# Patient Record
Sex: Female | Born: 1935 | Race: White | Hispanic: No | Smoking: Never smoker
Health system: Southern US, Community
[De-identification: ages and names within clinical notes are randomized; demographics above are authoritative.]

## PROBLEM LIST (undated history)

## (undated) DIAGNOSIS — S30814A Abrasion of vagina and vulva, initial encounter: Secondary | ICD-10-CM

## (undated) DIAGNOSIS — E119 Type 2 diabetes mellitus without complications: Secondary | ICD-10-CM

## (undated) DIAGNOSIS — N813 Complete uterovaginal prolapse: Secondary | ICD-10-CM

## (undated) DIAGNOSIS — IMO0002 Reserved for concepts with insufficient information to code with codable children: Secondary | ICD-10-CM

## (undated) DIAGNOSIS — N952 Postmenopausal atrophic vaginitis: Secondary | ICD-10-CM

## (undated) DIAGNOSIS — G4700 Insomnia, unspecified: Secondary | ICD-10-CM

## (undated) DIAGNOSIS — N814 Uterovaginal prolapse, unspecified: Secondary | ICD-10-CM

## (undated) DIAGNOSIS — N816 Rectocele: Secondary | ICD-10-CM

## (undated) DIAGNOSIS — I10 Essential (primary) hypertension: Secondary | ICD-10-CM

## (undated) DIAGNOSIS — N898 Other specified noninflammatory disorders of vagina: Secondary | ICD-10-CM

## (undated) DIAGNOSIS — N841 Polyp of cervix uteri: Secondary | ICD-10-CM

## (undated) HISTORY — DX: Type 2 diabetes mellitus without complications: E11.9

## (undated) HISTORY — DX: Uterovaginal prolapse, unspecified: N81.4

## (undated) HISTORY — PX: TUBAL LIGATION: SHX77

## (undated) HISTORY — DX: Postmenopausal atrophic vaginitis: N95.2

## (undated) HISTORY — DX: Rectocele: N81.6

## (undated) HISTORY — DX: Essential (primary) hypertension: I10

## (undated) HISTORY — DX: Complete uterovaginal prolapse: N81.3

## (undated) HISTORY — DX: Abrasion of vagina and vulva, initial encounter: S30.814A

## (undated) HISTORY — DX: Reserved for concepts with insufficient information to code with codable children: IMO0002

## (undated) HISTORY — DX: Other specified noninflammatory disorders of vagina: N89.8

## (undated) HISTORY — DX: Polyp of cervix uteri: N84.1

## (undated) HISTORY — DX: Insomnia, unspecified: G47.00

---

## 2010-11-29 ENCOUNTER — Emergency Department: Payer: Self-pay | Admitting: Emergency Medicine

## 2010-12-10 ENCOUNTER — Emergency Department: Payer: Self-pay | Admitting: Unknown Physician Specialty

## 2010-12-19 ENCOUNTER — Emergency Department: Payer: Self-pay | Admitting: Internal Medicine

## 2011-09-04 DIAGNOSIS — L821 Other seborrheic keratosis: Secondary | ICD-10-CM | POA: Diagnosis not present

## 2011-09-04 DIAGNOSIS — E119 Type 2 diabetes mellitus without complications: Secondary | ICD-10-CM | POA: Diagnosis not present

## 2011-09-04 DIAGNOSIS — I10 Essential (primary) hypertension: Secondary | ICD-10-CM | POA: Diagnosis not present

## 2011-12-01 DIAGNOSIS — L821 Other seborrheic keratosis: Secondary | ICD-10-CM | POA: Diagnosis not present

## 2011-12-01 DIAGNOSIS — I10 Essential (primary) hypertension: Secondary | ICD-10-CM | POA: Diagnosis not present

## 2011-12-01 DIAGNOSIS — E119 Type 2 diabetes mellitus without complications: Secondary | ICD-10-CM | POA: Diagnosis not present

## 2011-12-29 DIAGNOSIS — I10 Essential (primary) hypertension: Secondary | ICD-10-CM | POA: Diagnosis not present

## 2011-12-29 DIAGNOSIS — L821 Other seborrheic keratosis: Secondary | ICD-10-CM | POA: Diagnosis not present

## 2011-12-29 DIAGNOSIS — E119 Type 2 diabetes mellitus without complications: Secondary | ICD-10-CM | POA: Diagnosis not present

## 2012-01-25 DIAGNOSIS — E119 Type 2 diabetes mellitus without complications: Secondary | ICD-10-CM | POA: Diagnosis not present

## 2012-01-25 DIAGNOSIS — N814 Uterovaginal prolapse, unspecified: Secondary | ICD-10-CM | POA: Diagnosis not present

## 2012-01-25 DIAGNOSIS — L821 Other seborrheic keratosis: Secondary | ICD-10-CM | POA: Diagnosis not present

## 2012-01-25 DIAGNOSIS — I10 Essential (primary) hypertension: Secondary | ICD-10-CM | POA: Diagnosis not present

## 2012-02-23 DIAGNOSIS — N8111 Cystocele, midline: Secondary | ICD-10-CM | POA: Diagnosis not present

## 2012-02-23 DIAGNOSIS — N814 Uterovaginal prolapse, unspecified: Secondary | ICD-10-CM | POA: Diagnosis not present

## 2012-02-23 DIAGNOSIS — N816 Rectocele: Secondary | ICD-10-CM | POA: Diagnosis not present

## 2012-02-24 DIAGNOSIS — N814 Uterovaginal prolapse, unspecified: Secondary | ICD-10-CM | POA: Diagnosis not present

## 2012-02-24 DIAGNOSIS — N8111 Cystocele, midline: Secondary | ICD-10-CM | POA: Diagnosis not present

## 2012-02-24 DIAGNOSIS — N952 Postmenopausal atrophic vaginitis: Secondary | ICD-10-CM | POA: Diagnosis not present

## 2012-02-24 DIAGNOSIS — N813 Complete uterovaginal prolapse: Secondary | ICD-10-CM | POA: Diagnosis not present

## 2012-02-24 DIAGNOSIS — N816 Rectocele: Secondary | ICD-10-CM | POA: Diagnosis not present

## 2012-03-14 DIAGNOSIS — N8111 Cystocele, midline: Secondary | ICD-10-CM | POA: Diagnosis not present

## 2012-03-14 DIAGNOSIS — N816 Rectocele: Secondary | ICD-10-CM | POA: Diagnosis not present

## 2012-03-14 DIAGNOSIS — N952 Postmenopausal atrophic vaginitis: Secondary | ICD-10-CM | POA: Diagnosis not present

## 2012-03-14 DIAGNOSIS — N813 Complete uterovaginal prolapse: Secondary | ICD-10-CM | POA: Diagnosis not present

## 2012-03-22 DIAGNOSIS — N8111 Cystocele, midline: Secondary | ICD-10-CM | POA: Diagnosis not present

## 2012-03-22 DIAGNOSIS — N813 Complete uterovaginal prolapse: Secondary | ICD-10-CM | POA: Diagnosis not present

## 2012-03-22 DIAGNOSIS — N816 Rectocele: Secondary | ICD-10-CM | POA: Diagnosis not present

## 2012-04-19 DIAGNOSIS — N814 Uterovaginal prolapse, unspecified: Secondary | ICD-10-CM | POA: Diagnosis not present

## 2012-04-19 DIAGNOSIS — N952 Postmenopausal atrophic vaginitis: Secondary | ICD-10-CM | POA: Diagnosis not present

## 2012-04-19 DIAGNOSIS — N8111 Cystocele, midline: Secondary | ICD-10-CM | POA: Diagnosis not present

## 2012-04-19 DIAGNOSIS — N816 Rectocele: Secondary | ICD-10-CM | POA: Diagnosis not present

## 2012-04-27 DIAGNOSIS — N814 Uterovaginal prolapse, unspecified: Secondary | ICD-10-CM | POA: Diagnosis not present

## 2012-04-27 DIAGNOSIS — I10 Essential (primary) hypertension: Secondary | ICD-10-CM | POA: Diagnosis not present

## 2012-04-27 DIAGNOSIS — E119 Type 2 diabetes mellitus without complications: Secondary | ICD-10-CM | POA: Diagnosis not present

## 2012-05-31 DIAGNOSIS — N816 Rectocele: Secondary | ICD-10-CM | POA: Diagnosis not present

## 2012-05-31 DIAGNOSIS — N813 Complete uterovaginal prolapse: Secondary | ICD-10-CM | POA: Diagnosis not present

## 2012-05-31 DIAGNOSIS — N8111 Cystocele, midline: Secondary | ICD-10-CM | POA: Diagnosis not present

## 2012-05-31 DIAGNOSIS — IMO0002 Reserved for concepts with insufficient information to code with codable children: Secondary | ICD-10-CM | POA: Diagnosis not present

## 2012-05-31 IMAGING — CT CT HEAD WITHOUT CONTRAST
2 series · 16 of 30 positions shown, 20 images · non-contrast
Comparison: none

REASON FOR EXAM: fall, hit head on concrete
COMMENTS:

PROCEDURE:     CT  - CT HEAD WITHOUT CONTRAST  - November 29, 2010  [DATE]
RESULT:     History: Fall.
Comparison Study: No prior.

[Series 2: without · axial · non-contrast · 0.41mm/px · z∈[-212,-92]mm · 13 of 29 slices shown, 17 images]
[im 3/29  brain]
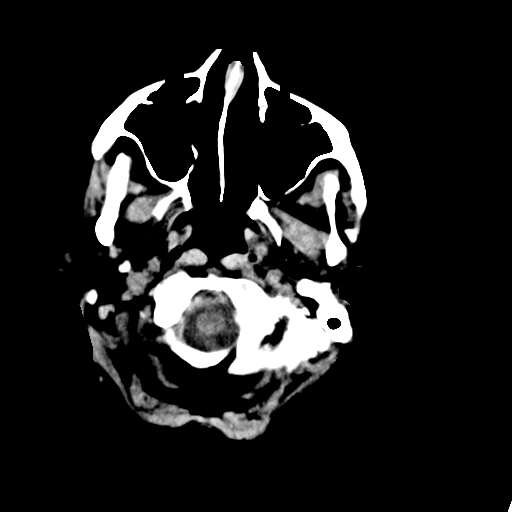
[im 3/29  bone]
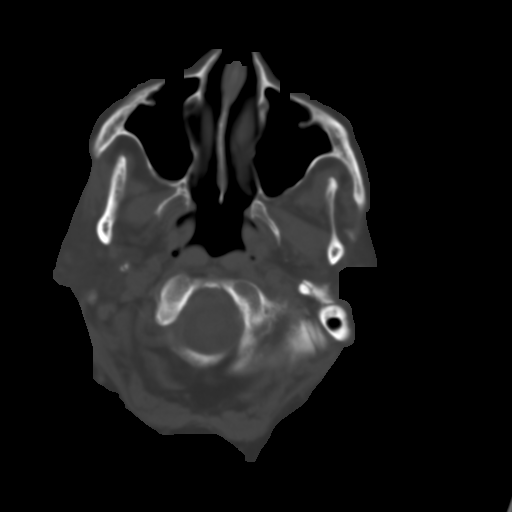
[im 5/29  brain]
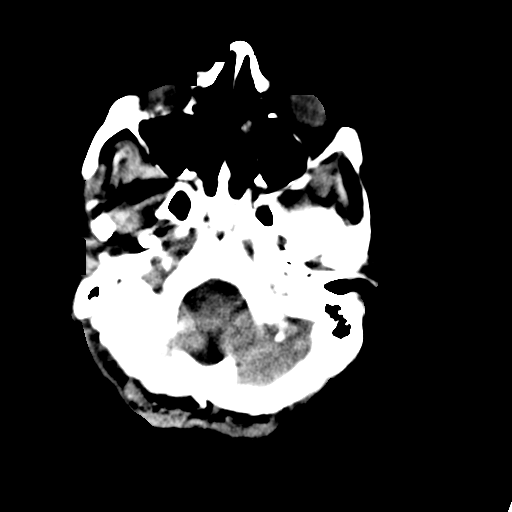
[im 7/29  brain]
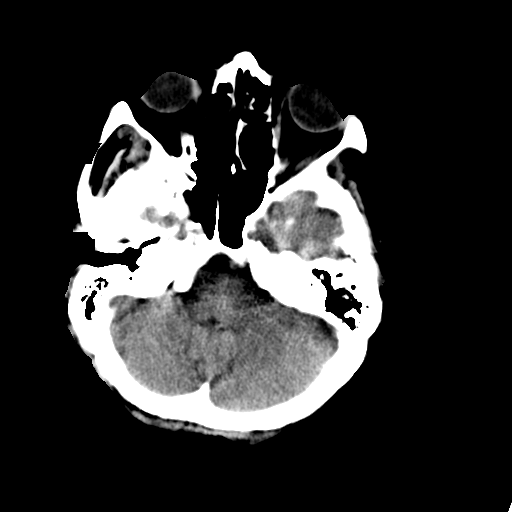
[im 9/29  brain]
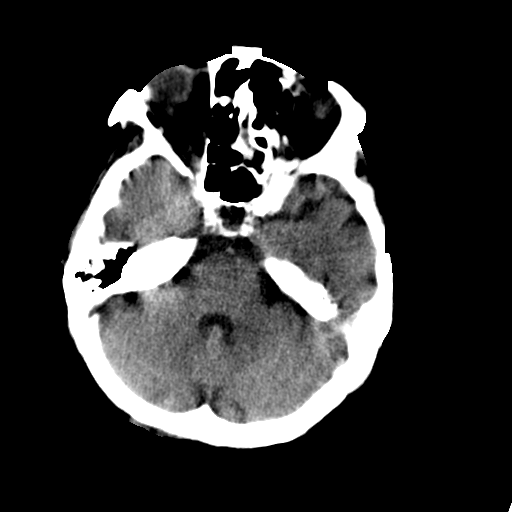
[im 11/29  brain]
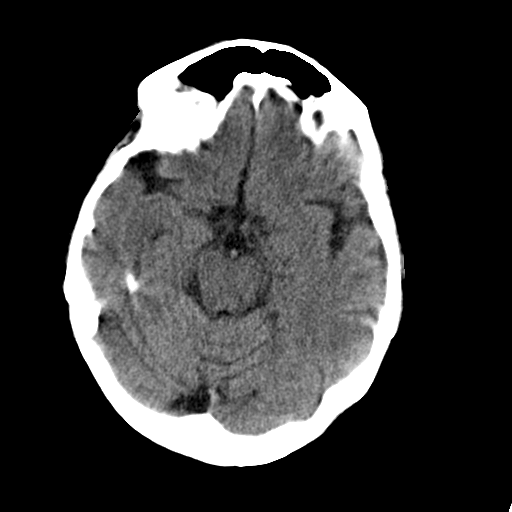
[im 11/29  bone]
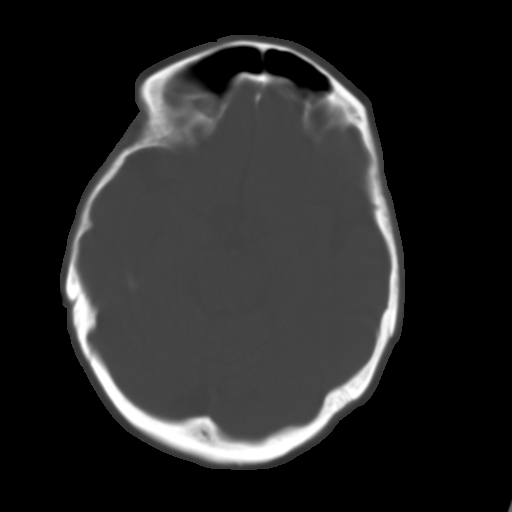
[im 13/29  brain]
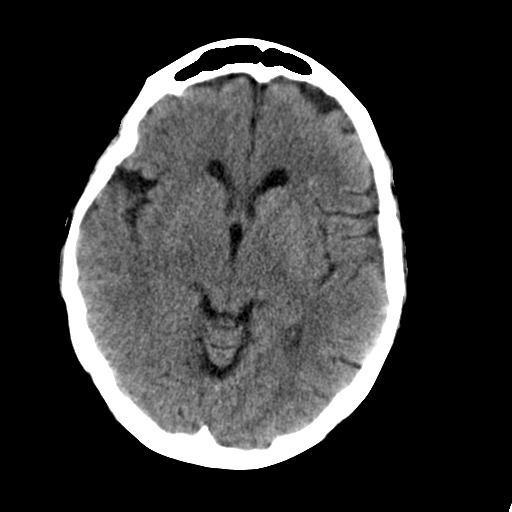
[im 15/29  brain]
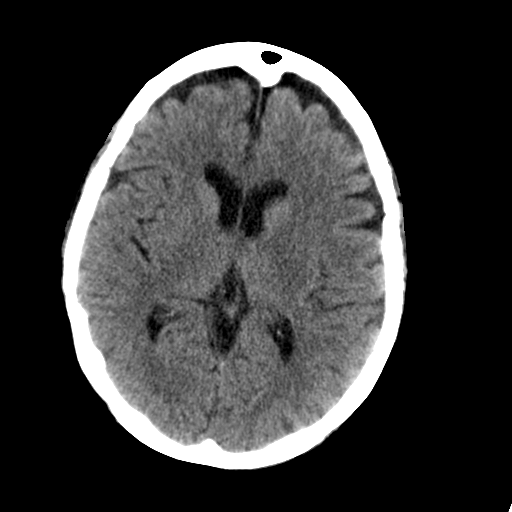
[im 17/29  brain]
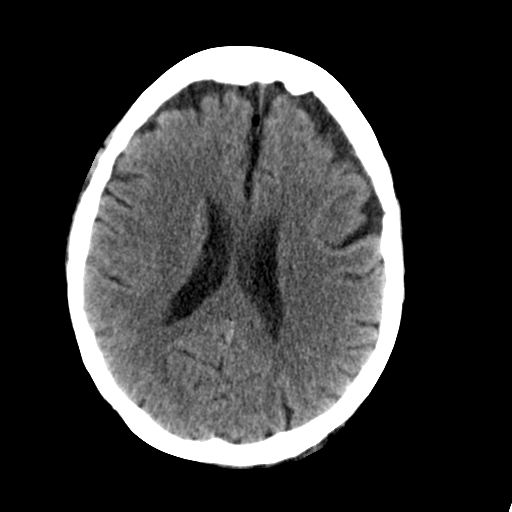
[im 19/29  brain]
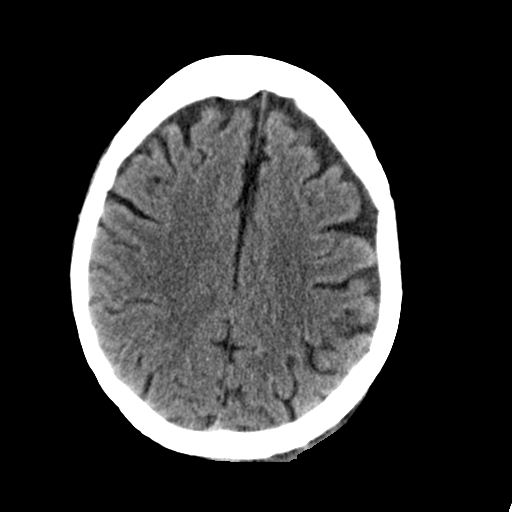
[im 19/29  bone]
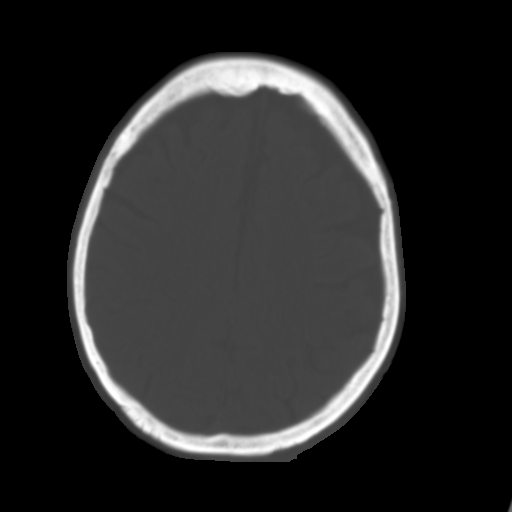
[im 21/29  brain]
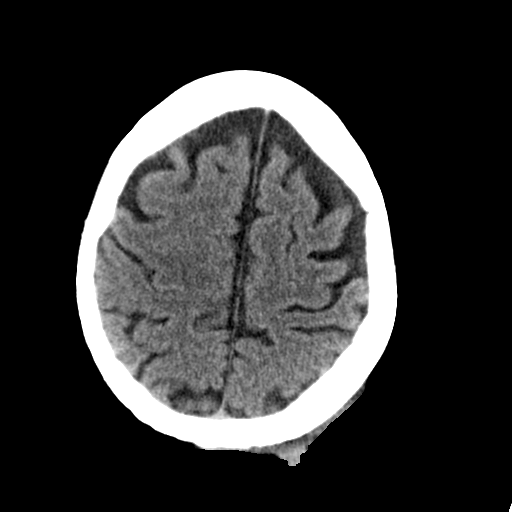
[im 23/29  brain]
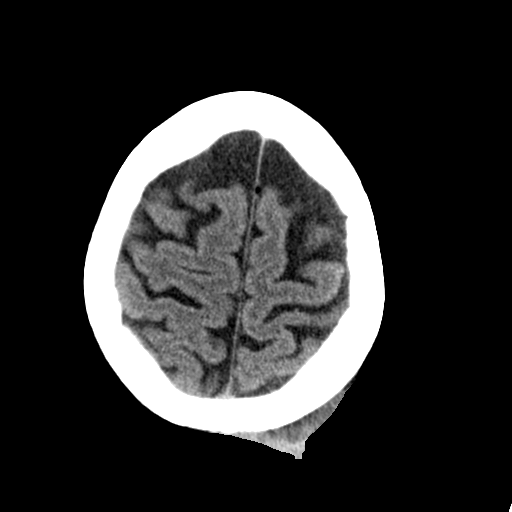
[im 25/29  brain]
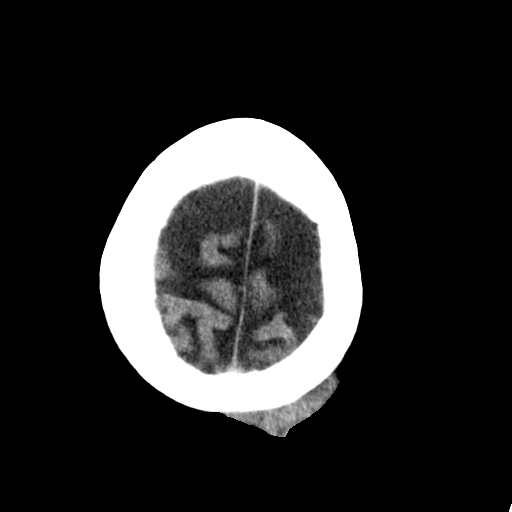
[im 27/29  brain]
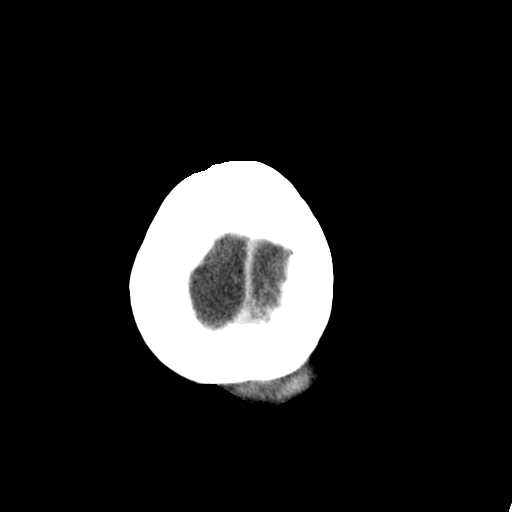
[im 27/29  bone]
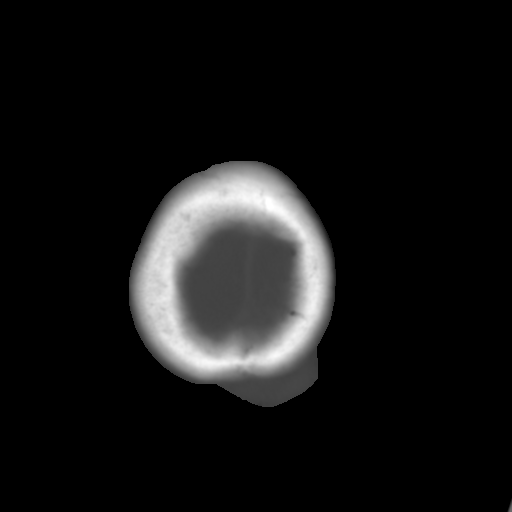

[Series 3: bone · axial · 0.41mm/px · z∈[-212,-172]mm · 3 of 29 slices shown]
[im 3/29  bone]
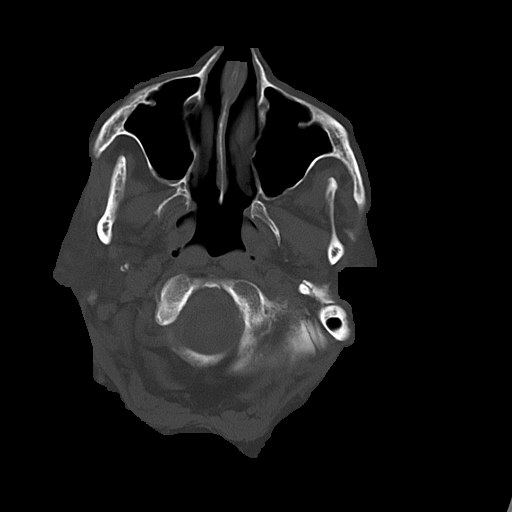
[im 7/29  bone]
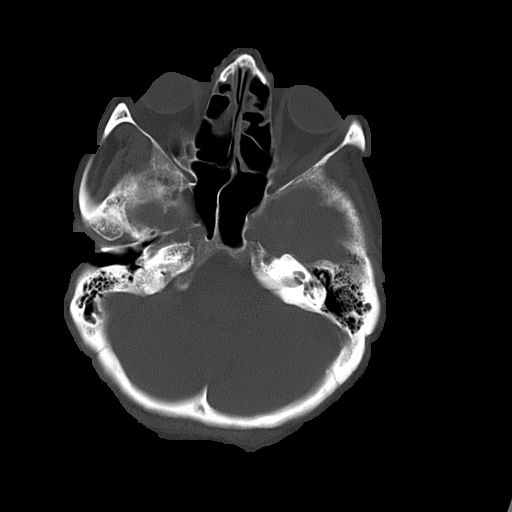
[im 11/29  bone]
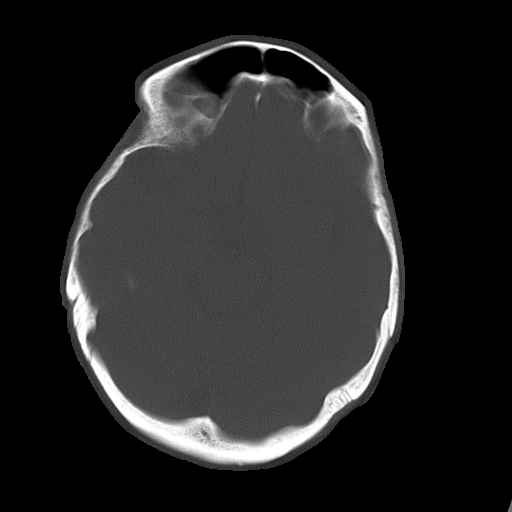

[16 of 30 positions shown; findings below may reference images not displayed]

FINDINGS: Standard nonenhanced CT obtained. No mass. No hydrocephalus. No
hemorrhage. Left posterior scalp hematoma. No acute bony abnormality.
IMPRESSION: Scalp hematoma. Exam otherwise negative.

## 2012-05-31 IMAGING — CT CT CERVICAL SPINE WITHOUT CONTRAST
1 series · 12 of 14 positions shown, 15 images · non-contrast
Comparison: none

REASON FOR EXAM: fell down steps, hit head. initially no neck pain now w/
neck pain about c4 leve
COMMENTS:

PROCEDURE:     CT  - CT CERVICAL SPINE WO  - November 29, 2010  [DATE]
RESULT:     History: Trauma.

[Series 6: axial · axial · 0.33mm/px · z∈[-189,-59]mm · 12 of 77 slices shown, 15 images]
[im 6/77  soft-tissue]
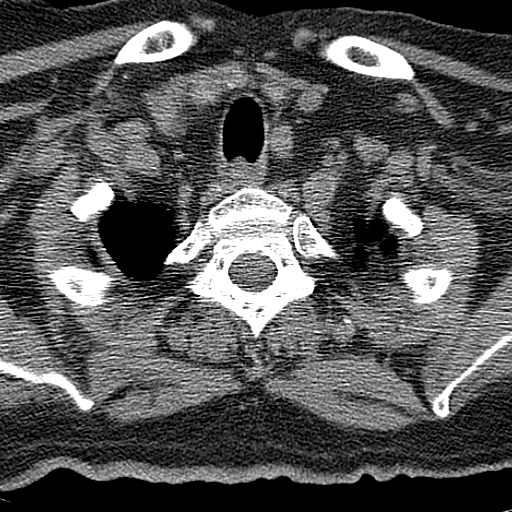
[im 6/77  bone]
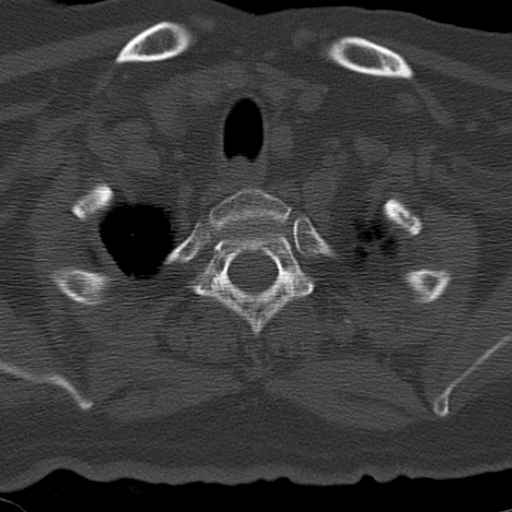
[im 12/77  bone]
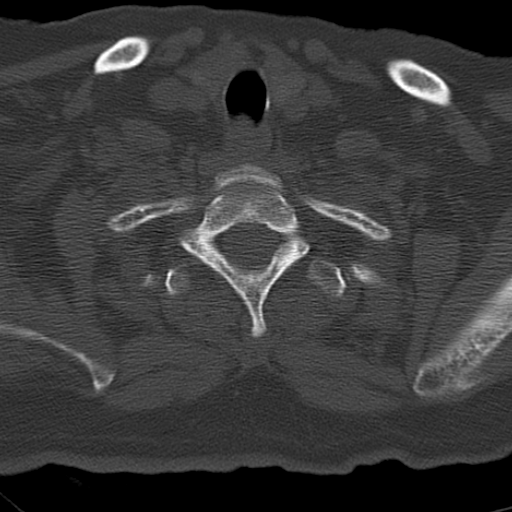
[im 18/77  bone]
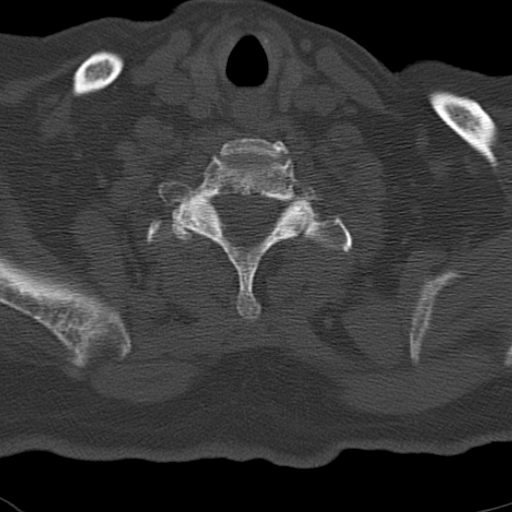
[im 24/77  bone]
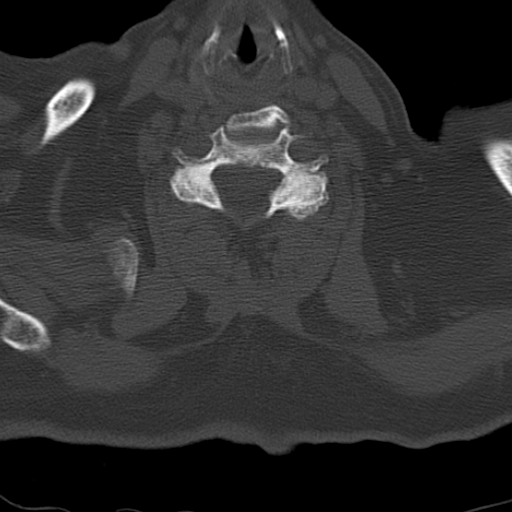
[im 30/77  soft-tissue]
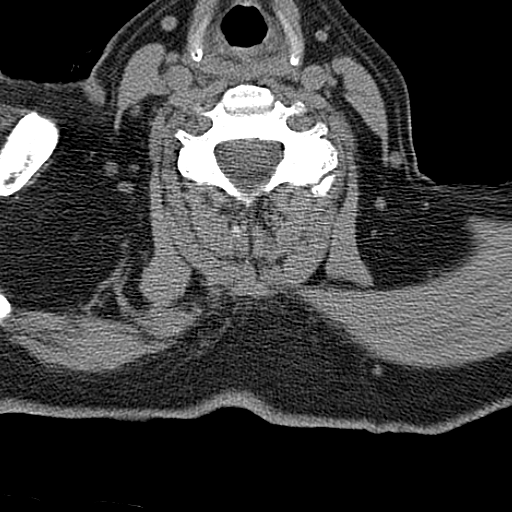
[im 30/77  bone]
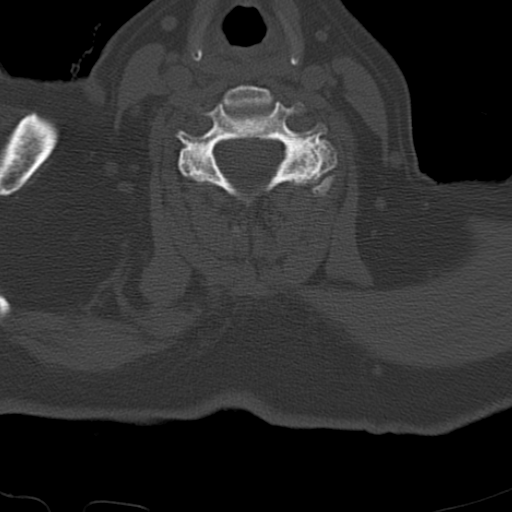
[im 36/77  bone]
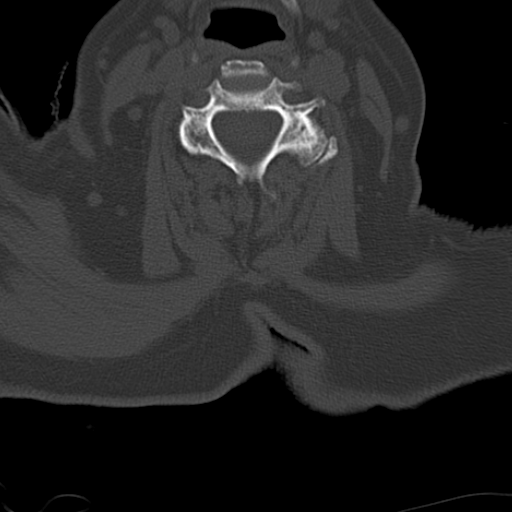
[im 41/77  bone]
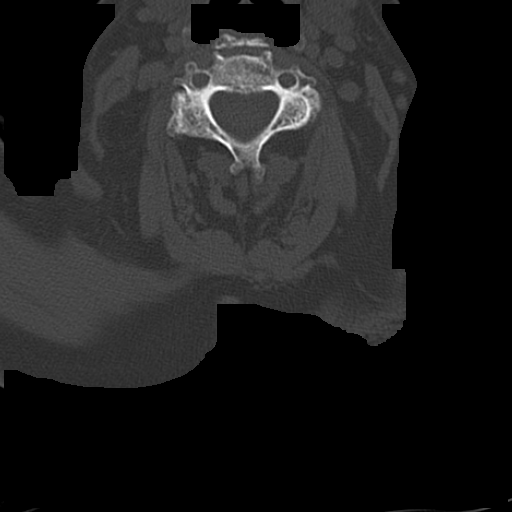
[im 47/77  bone]
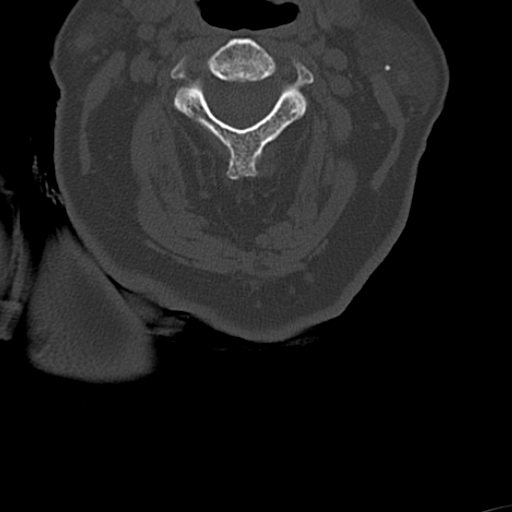
[im 53/77  soft-tissue]
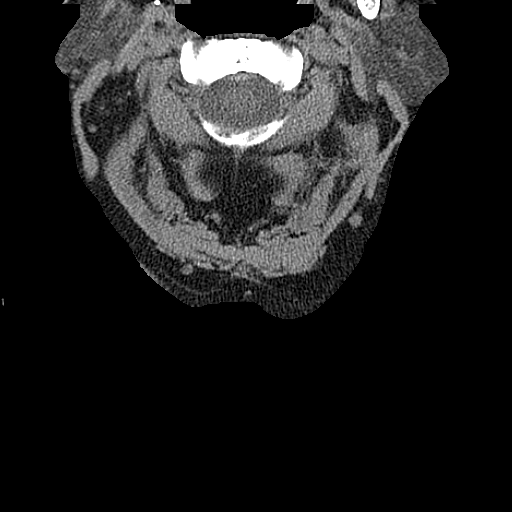
[im 53/77  bone]
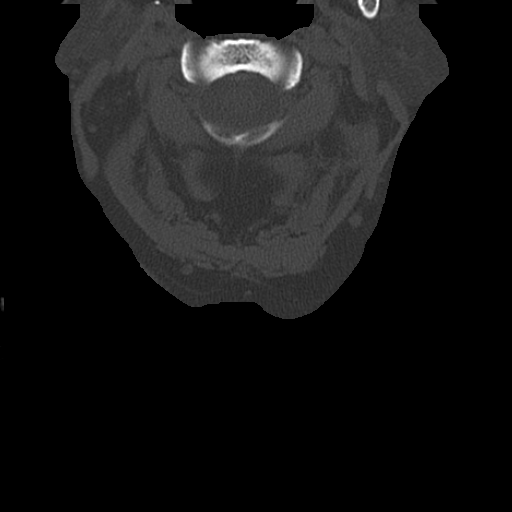
[im 59/77  bone]
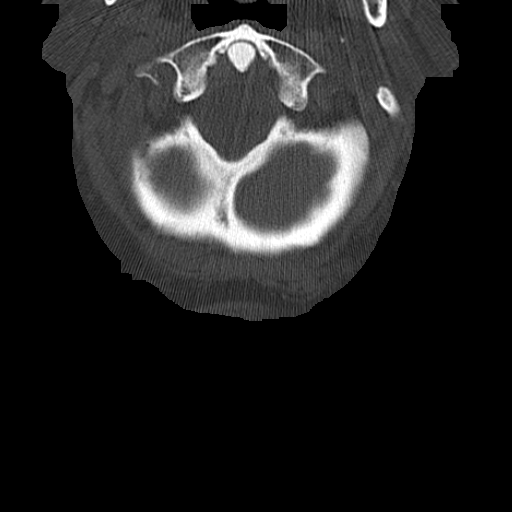
[im 65/77  bone]
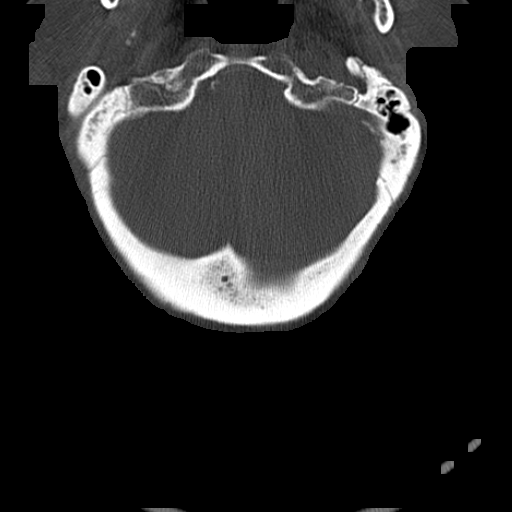
[im 71/77  bone]
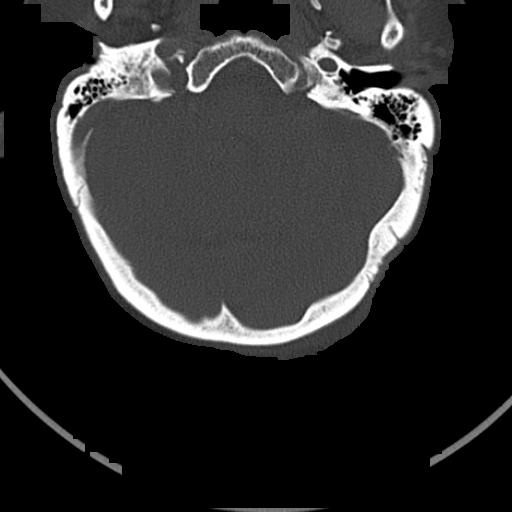

[12 of 14 positions shown; findings below may reference images not displayed]

FINDINGS: Standard CT of the cervical spine is obtained. Diffuse severe DJD
is present. No evidence of fracture or dislocation.
IMPRESSION: Severe DJD. No acute abnormality.

## 2012-06-08 DIAGNOSIS — N813 Complete uterovaginal prolapse: Secondary | ICD-10-CM | POA: Diagnosis not present

## 2012-06-08 DIAGNOSIS — N8111 Cystocele, midline: Secondary | ICD-10-CM | POA: Diagnosis not present

## 2012-06-08 DIAGNOSIS — N952 Postmenopausal atrophic vaginitis: Secondary | ICD-10-CM | POA: Diagnosis not present

## 2012-06-08 DIAGNOSIS — N816 Rectocele: Secondary | ICD-10-CM | POA: Diagnosis not present

## 2012-06-23 DIAGNOSIS — N816 Rectocele: Secondary | ICD-10-CM | POA: Diagnosis not present

## 2012-06-23 DIAGNOSIS — N8111 Cystocele, midline: Secondary | ICD-10-CM | POA: Diagnosis not present

## 2012-06-23 DIAGNOSIS — N813 Complete uterovaginal prolapse: Secondary | ICD-10-CM | POA: Diagnosis not present

## 2012-06-23 DIAGNOSIS — N952 Postmenopausal atrophic vaginitis: Secondary | ICD-10-CM | POA: Diagnosis not present

## 2012-06-24 DIAGNOSIS — Z23 Encounter for immunization: Secondary | ICD-10-CM | POA: Diagnosis not present

## 2012-06-24 DIAGNOSIS — E119 Type 2 diabetes mellitus without complications: Secondary | ICD-10-CM | POA: Diagnosis not present

## 2012-07-26 DIAGNOSIS — N813 Complete uterovaginal prolapse: Secondary | ICD-10-CM | POA: Diagnosis not present

## 2012-07-26 DIAGNOSIS — N952 Postmenopausal atrophic vaginitis: Secondary | ICD-10-CM | POA: Diagnosis not present

## 2012-07-26 DIAGNOSIS — N8111 Cystocele, midline: Secondary | ICD-10-CM | POA: Diagnosis not present

## 2012-07-26 DIAGNOSIS — N816 Rectocele: Secondary | ICD-10-CM | POA: Diagnosis not present

## 2012-08-26 DIAGNOSIS — L821 Other seborrheic keratosis: Secondary | ICD-10-CM | POA: Diagnosis not present

## 2012-08-26 DIAGNOSIS — I359 Nonrheumatic aortic valve disorder, unspecified: Secondary | ICD-10-CM | POA: Diagnosis not present

## 2012-08-26 DIAGNOSIS — E119 Type 2 diabetes mellitus without complications: Secondary | ICD-10-CM | POA: Diagnosis not present

## 2012-08-26 DIAGNOSIS — I10 Essential (primary) hypertension: Secondary | ICD-10-CM | POA: Diagnosis not present

## 2012-09-06 DIAGNOSIS — N813 Complete uterovaginal prolapse: Secondary | ICD-10-CM | POA: Diagnosis not present

## 2012-09-06 DIAGNOSIS — N952 Postmenopausal atrophic vaginitis: Secondary | ICD-10-CM | POA: Diagnosis not present

## 2012-09-06 DIAGNOSIS — N8111 Cystocele, midline: Secondary | ICD-10-CM | POA: Diagnosis not present

## 2012-09-06 DIAGNOSIS — N816 Rectocele: Secondary | ICD-10-CM | POA: Diagnosis not present

## 2012-10-21 DIAGNOSIS — L821 Other seborrheic keratosis: Secondary | ICD-10-CM | POA: Diagnosis not present

## 2012-10-21 DIAGNOSIS — N814 Uterovaginal prolapse, unspecified: Secondary | ICD-10-CM | POA: Diagnosis not present

## 2012-10-21 DIAGNOSIS — I10 Essential (primary) hypertension: Secondary | ICD-10-CM | POA: Diagnosis not present

## 2012-10-21 DIAGNOSIS — I359 Nonrheumatic aortic valve disorder, unspecified: Secondary | ICD-10-CM | POA: Diagnosis not present

## 2012-10-21 DIAGNOSIS — E119 Type 2 diabetes mellitus without complications: Secondary | ICD-10-CM | POA: Diagnosis not present

## 2012-10-27 DIAGNOSIS — T887XXA Unspecified adverse effect of drug or medicament, initial encounter: Secondary | ICD-10-CM | POA: Diagnosis not present

## 2012-10-31 ENCOUNTER — Ambulatory Visit: Payer: Self-pay | Admitting: Internal Medicine

## 2012-10-31 DIAGNOSIS — I10 Essential (primary) hypertension: Secondary | ICD-10-CM | POA: Diagnosis not present

## 2012-10-31 DIAGNOSIS — I4949 Other premature depolarization: Secondary | ICD-10-CM | POA: Diagnosis not present

## 2012-10-31 DIAGNOSIS — R0602 Shortness of breath: Secondary | ICD-10-CM | POA: Diagnosis not present

## 2012-10-31 DIAGNOSIS — R079 Chest pain, unspecified: Secondary | ICD-10-CM | POA: Diagnosis not present

## 2012-11-02 DIAGNOSIS — J309 Allergic rhinitis, unspecified: Secondary | ICD-10-CM | POA: Diagnosis not present

## 2012-11-02 DIAGNOSIS — E119 Type 2 diabetes mellitus without complications: Secondary | ICD-10-CM | POA: Diagnosis not present

## 2012-11-11 DIAGNOSIS — I10 Essential (primary) hypertension: Secondary | ICD-10-CM | POA: Diagnosis not present

## 2012-11-11 DIAGNOSIS — E119 Type 2 diabetes mellitus without complications: Secondary | ICD-10-CM | POA: Diagnosis not present

## 2012-11-11 DIAGNOSIS — L821 Other seborrheic keratosis: Secondary | ICD-10-CM | POA: Diagnosis not present

## 2012-12-06 DIAGNOSIS — N813 Complete uterovaginal prolapse: Secondary | ICD-10-CM | POA: Diagnosis not present

## 2012-12-06 DIAGNOSIS — N8111 Cystocele, midline: Secondary | ICD-10-CM | POA: Diagnosis not present

## 2012-12-06 DIAGNOSIS — N952 Postmenopausal atrophic vaginitis: Secondary | ICD-10-CM | POA: Diagnosis not present

## 2012-12-06 DIAGNOSIS — N816 Rectocele: Secondary | ICD-10-CM | POA: Diagnosis not present

## 2012-12-14 DIAGNOSIS — N814 Uterovaginal prolapse, unspecified: Secondary | ICD-10-CM | POA: Diagnosis not present

## 2012-12-14 DIAGNOSIS — IMO0002 Reserved for concepts with insufficient information to code with codable children: Secondary | ICD-10-CM | POA: Diagnosis not present

## 2012-12-14 DIAGNOSIS — N8111 Cystocele, midline: Secondary | ICD-10-CM | POA: Diagnosis not present

## 2012-12-14 DIAGNOSIS — N952 Postmenopausal atrophic vaginitis: Secondary | ICD-10-CM | POA: Diagnosis not present

## 2012-12-21 DIAGNOSIS — N841 Polyp of cervix uteri: Secondary | ICD-10-CM | POA: Diagnosis not present

## 2012-12-21 DIAGNOSIS — N952 Postmenopausal atrophic vaginitis: Secondary | ICD-10-CM | POA: Diagnosis not present

## 2012-12-21 DIAGNOSIS — N8111 Cystocele, midline: Secondary | ICD-10-CM | POA: Diagnosis not present

## 2012-12-21 DIAGNOSIS — N813 Complete uterovaginal prolapse: Secondary | ICD-10-CM | POA: Diagnosis not present

## 2013-01-23 DIAGNOSIS — N816 Rectocele: Secondary | ICD-10-CM | POA: Diagnosis not present

## 2013-01-23 DIAGNOSIS — N841 Polyp of cervix uteri: Secondary | ICD-10-CM | POA: Diagnosis not present

## 2013-01-23 DIAGNOSIS — N952 Postmenopausal atrophic vaginitis: Secondary | ICD-10-CM | POA: Diagnosis not present

## 2013-01-30 DIAGNOSIS — N814 Uterovaginal prolapse, unspecified: Secondary | ICD-10-CM | POA: Diagnosis not present

## 2013-01-30 DIAGNOSIS — I10 Essential (primary) hypertension: Secondary | ICD-10-CM | POA: Diagnosis not present

## 2013-01-30 DIAGNOSIS — L821 Other seborrheic keratosis: Secondary | ICD-10-CM | POA: Diagnosis not present

## 2013-01-30 DIAGNOSIS — E119 Type 2 diabetes mellitus without complications: Secondary | ICD-10-CM | POA: Diagnosis not present

## 2013-05-04 DIAGNOSIS — E119 Type 2 diabetes mellitus without complications: Secondary | ICD-10-CM | POA: Diagnosis not present

## 2013-05-04 DIAGNOSIS — Z23 Encounter for immunization: Secondary | ICD-10-CM | POA: Diagnosis not present

## 2013-05-04 DIAGNOSIS — I10 Essential (primary) hypertension: Secondary | ICD-10-CM | POA: Diagnosis not present

## 2013-05-04 DIAGNOSIS — Z1339 Encounter for screening examination for other mental health and behavioral disorders: Secondary | ICD-10-CM | POA: Diagnosis not present

## 2013-05-09 DIAGNOSIS — N8111 Cystocele, midline: Secondary | ICD-10-CM | POA: Diagnosis not present

## 2013-05-09 DIAGNOSIS — N816 Rectocele: Secondary | ICD-10-CM | POA: Diagnosis not present

## 2013-05-09 DIAGNOSIS — N952 Postmenopausal atrophic vaginitis: Secondary | ICD-10-CM | POA: Diagnosis not present

## 2013-05-09 DIAGNOSIS — N841 Polyp of cervix uteri: Secondary | ICD-10-CM | POA: Diagnosis not present

## 2013-07-19 DIAGNOSIS — E1129 Type 2 diabetes mellitus with other diabetic kidney complication: Secondary | ICD-10-CM | POA: Diagnosis not present

## 2013-07-19 DIAGNOSIS — R609 Edema, unspecified: Secondary | ICD-10-CM | POA: Diagnosis not present

## 2013-07-19 DIAGNOSIS — N189 Chronic kidney disease, unspecified: Secondary | ICD-10-CM | POA: Diagnosis not present

## 2013-07-19 DIAGNOSIS — Z789 Other specified health status: Secondary | ICD-10-CM | POA: Diagnosis not present

## 2013-08-09 DIAGNOSIS — R609 Edema, unspecified: Secondary | ICD-10-CM | POA: Diagnosis not present

## 2013-08-10 DIAGNOSIS — I10 Essential (primary) hypertension: Secondary | ICD-10-CM | POA: Diagnosis not present

## 2013-08-10 DIAGNOSIS — I4949 Other premature depolarization: Secondary | ICD-10-CM | POA: Diagnosis not present

## 2013-08-10 DIAGNOSIS — I359 Nonrheumatic aortic valve disorder, unspecified: Secondary | ICD-10-CM | POA: Diagnosis not present

## 2013-08-17 DIAGNOSIS — N899 Noninflammatory disorder of vagina, unspecified: Secondary | ICD-10-CM | POA: Diagnosis not present

## 2013-08-17 DIAGNOSIS — N8111 Cystocele, midline: Secondary | ICD-10-CM | POA: Diagnosis not present

## 2013-08-17 DIAGNOSIS — N952 Postmenopausal atrophic vaginitis: Secondary | ICD-10-CM | POA: Diagnosis not present

## 2013-08-17 DIAGNOSIS — N841 Polyp of cervix uteri: Secondary | ICD-10-CM | POA: Diagnosis not present

## 2013-08-24 DIAGNOSIS — N8111 Cystocele, midline: Secondary | ICD-10-CM | POA: Diagnosis not present

## 2013-08-24 DIAGNOSIS — N899 Noninflammatory disorder of vagina, unspecified: Secondary | ICD-10-CM | POA: Diagnosis not present

## 2013-08-24 DIAGNOSIS — N816 Rectocele: Secondary | ICD-10-CM | POA: Diagnosis not present

## 2013-08-29 DIAGNOSIS — E119 Type 2 diabetes mellitus without complications: Secondary | ICD-10-CM | POA: Diagnosis not present

## 2013-08-29 DIAGNOSIS — N181 Chronic kidney disease, stage 1: Secondary | ICD-10-CM | POA: Diagnosis not present

## 2013-08-29 DIAGNOSIS — I129 Hypertensive chronic kidney disease with stage 1 through stage 4 chronic kidney disease, or unspecified chronic kidney disease: Secondary | ICD-10-CM | POA: Diagnosis not present

## 2013-10-24 DIAGNOSIS — E119 Type 2 diabetes mellitus without complications: Secondary | ICD-10-CM | POA: Diagnosis not present

## 2013-10-24 DIAGNOSIS — J218 Acute bronchiolitis due to other specified organisms: Secondary | ICD-10-CM | POA: Diagnosis not present

## 2013-10-31 DIAGNOSIS — E119 Type 2 diabetes mellitus without complications: Secondary | ICD-10-CM | POA: Diagnosis not present

## 2013-10-31 DIAGNOSIS — J309 Allergic rhinitis, unspecified: Secondary | ICD-10-CM | POA: Diagnosis not present

## 2013-10-31 DIAGNOSIS — I10 Essential (primary) hypertension: Secondary | ICD-10-CM | POA: Diagnosis not present

## 2013-11-29 DIAGNOSIS — N816 Rectocele: Secondary | ICD-10-CM | POA: Diagnosis not present

## 2013-11-29 DIAGNOSIS — N8111 Cystocele, midline: Secondary | ICD-10-CM | POA: Diagnosis not present

## 2013-11-29 DIAGNOSIS — N899 Noninflammatory disorder of vagina, unspecified: Secondary | ICD-10-CM | POA: Diagnosis not present

## 2013-11-29 DIAGNOSIS — Z4689 Encounter for fitting and adjustment of other specified devices: Secondary | ICD-10-CM | POA: Diagnosis not present

## 2014-01-01 DIAGNOSIS — L821 Other seborrheic keratosis: Secondary | ICD-10-CM | POA: Diagnosis not present

## 2014-01-01 DIAGNOSIS — I359 Nonrheumatic aortic valve disorder, unspecified: Secondary | ICD-10-CM | POA: Diagnosis not present

## 2014-01-01 DIAGNOSIS — N814 Uterovaginal prolapse, unspecified: Secondary | ICD-10-CM | POA: Diagnosis not present

## 2014-01-01 DIAGNOSIS — I10 Essential (primary) hypertension: Secondary | ICD-10-CM | POA: Diagnosis not present

## 2014-01-23 DIAGNOSIS — L821 Other seborrheic keratosis: Secondary | ICD-10-CM | POA: Diagnosis not present

## 2014-01-23 DIAGNOSIS — I359 Nonrheumatic aortic valve disorder, unspecified: Secondary | ICD-10-CM | POA: Diagnosis not present

## 2014-01-23 DIAGNOSIS — E119 Type 2 diabetes mellitus without complications: Secondary | ICD-10-CM | POA: Diagnosis not present

## 2014-01-23 DIAGNOSIS — N814 Uterovaginal prolapse, unspecified: Secondary | ICD-10-CM | POA: Diagnosis not present

## 2014-03-01 DIAGNOSIS — Z4689 Encounter for fitting and adjustment of other specified devices: Secondary | ICD-10-CM | POA: Diagnosis not present

## 2014-03-01 DIAGNOSIS — N816 Rectocele: Secondary | ICD-10-CM | POA: Diagnosis not present

## 2014-03-01 DIAGNOSIS — N899 Noninflammatory disorder of vagina, unspecified: Secondary | ICD-10-CM | POA: Diagnosis not present

## 2014-03-01 DIAGNOSIS — N8111 Cystocele, midline: Secondary | ICD-10-CM | POA: Diagnosis not present

## 2014-04-03 DIAGNOSIS — E119 Type 2 diabetes mellitus without complications: Secondary | ICD-10-CM | POA: Diagnosis not present

## 2014-05-10 DIAGNOSIS — N899 Noninflammatory disorder of vagina, unspecified: Secondary | ICD-10-CM | POA: Diagnosis not present

## 2014-05-10 DIAGNOSIS — N816 Rectocele: Secondary | ICD-10-CM | POA: Diagnosis not present

## 2014-05-10 DIAGNOSIS — N8110 Cystocele, unspecified: Secondary | ICD-10-CM | POA: Diagnosis not present

## 2014-05-10 DIAGNOSIS — N812 Incomplete uterovaginal prolapse: Secondary | ICD-10-CM | POA: Diagnosis not present

## 2014-05-10 DIAGNOSIS — Z4689 Encounter for fitting and adjustment of other specified devices: Secondary | ICD-10-CM | POA: Diagnosis not present

## 2014-06-04 DIAGNOSIS — L658 Other specified nonscarring hair loss: Secondary | ICD-10-CM | POA: Diagnosis not present

## 2014-06-04 DIAGNOSIS — L821 Other seborrheic keratosis: Secondary | ICD-10-CM | POA: Diagnosis not present

## 2014-06-04 DIAGNOSIS — N814 Uterovaginal prolapse, unspecified: Secondary | ICD-10-CM | POA: Diagnosis not present

## 2014-06-04 DIAGNOSIS — E119 Type 2 diabetes mellitus without complications: Secondary | ICD-10-CM | POA: Diagnosis not present

## 2014-06-05 DIAGNOSIS — Z23 Encounter for immunization: Secondary | ICD-10-CM | POA: Diagnosis not present

## 2014-07-19 DIAGNOSIS — N816 Rectocele: Secondary | ICD-10-CM | POA: Diagnosis not present

## 2014-07-19 DIAGNOSIS — Z4689 Encounter for fitting and adjustment of other specified devices: Secondary | ICD-10-CM | POA: Diagnosis not present

## 2014-07-19 DIAGNOSIS — N8110 Cystocele, unspecified: Secondary | ICD-10-CM | POA: Diagnosis not present

## 2014-07-19 DIAGNOSIS — N899 Noninflammatory disorder of vagina, unspecified: Secondary | ICD-10-CM | POA: Diagnosis not present

## 2014-07-19 DIAGNOSIS — N812 Incomplete uterovaginal prolapse: Secondary | ICD-10-CM | POA: Diagnosis not present

## 2014-07-29 DIAGNOSIS — Z23 Encounter for immunization: Secondary | ICD-10-CM | POA: Diagnosis not present

## 2014-08-06 DIAGNOSIS — T148 Other injury of unspecified body region: Secondary | ICD-10-CM | POA: Diagnosis not present

## 2014-08-06 DIAGNOSIS — L239 Allergic contact dermatitis, unspecified cause: Secondary | ICD-10-CM | POA: Diagnosis not present

## 2014-08-06 DIAGNOSIS — E119 Type 2 diabetes mellitus without complications: Secondary | ICD-10-CM | POA: Diagnosis not present

## 2014-08-06 DIAGNOSIS — Z1389 Encounter for screening for other disorder: Secondary | ICD-10-CM | POA: Diagnosis not present

## 2014-08-06 DIAGNOSIS — L0390 Cellulitis, unspecified: Secondary | ICD-10-CM | POA: Diagnosis not present

## 2014-09-18 DIAGNOSIS — N816 Rectocele: Secondary | ICD-10-CM | POA: Diagnosis not present

## 2014-09-18 DIAGNOSIS — Z4689 Encounter for fitting and adjustment of other specified devices: Secondary | ICD-10-CM | POA: Diagnosis not present

## 2014-09-18 DIAGNOSIS — N8110 Cystocele, unspecified: Secondary | ICD-10-CM | POA: Diagnosis not present

## 2014-09-18 DIAGNOSIS — N899 Noninflammatory disorder of vagina, unspecified: Secondary | ICD-10-CM | POA: Diagnosis not present

## 2014-09-18 DIAGNOSIS — N812 Incomplete uterovaginal prolapse: Secondary | ICD-10-CM | POA: Diagnosis not present

## 2014-09-26 DIAGNOSIS — N899 Noninflammatory disorder of vagina, unspecified: Secondary | ICD-10-CM | POA: Diagnosis not present

## 2014-09-26 DIAGNOSIS — N812 Incomplete uterovaginal prolapse: Secondary | ICD-10-CM | POA: Diagnosis not present

## 2014-09-26 DIAGNOSIS — N816 Rectocele: Secondary | ICD-10-CM | POA: Diagnosis not present

## 2014-09-26 DIAGNOSIS — Z4689 Encounter for fitting and adjustment of other specified devices: Secondary | ICD-10-CM | POA: Diagnosis not present

## 2014-09-26 DIAGNOSIS — N8110 Cystocele, unspecified: Secondary | ICD-10-CM | POA: Diagnosis not present

## 2014-10-02 DIAGNOSIS — Z4689 Encounter for fitting and adjustment of other specified devices: Secondary | ICD-10-CM | POA: Diagnosis not present

## 2014-10-02 DIAGNOSIS — N8110 Cystocele, unspecified: Secondary | ICD-10-CM | POA: Diagnosis not present

## 2014-10-02 DIAGNOSIS — N812 Incomplete uterovaginal prolapse: Secondary | ICD-10-CM | POA: Diagnosis not present

## 2014-10-02 DIAGNOSIS — N816 Rectocele: Secondary | ICD-10-CM | POA: Diagnosis not present

## 2014-10-02 DIAGNOSIS — N899 Noninflammatory disorder of vagina, unspecified: Secondary | ICD-10-CM | POA: Diagnosis not present

## 2014-10-04 DIAGNOSIS — D229 Melanocytic nevi, unspecified: Secondary | ICD-10-CM | POA: Diagnosis not present

## 2014-10-04 DIAGNOSIS — E119 Type 2 diabetes mellitus without complications: Secondary | ICD-10-CM | POA: Diagnosis not present

## 2014-10-30 DIAGNOSIS — N812 Incomplete uterovaginal prolapse: Secondary | ICD-10-CM | POA: Diagnosis not present

## 2014-10-30 DIAGNOSIS — N816 Rectocele: Secondary | ICD-10-CM | POA: Diagnosis not present

## 2014-10-30 DIAGNOSIS — N899 Noninflammatory disorder of vagina, unspecified: Secondary | ICD-10-CM | POA: Diagnosis not present

## 2014-10-30 DIAGNOSIS — N8110 Cystocele, unspecified: Secondary | ICD-10-CM | POA: Diagnosis not present

## 2014-10-30 DIAGNOSIS — Z4689 Encounter for fitting and adjustment of other specified devices: Secondary | ICD-10-CM | POA: Diagnosis not present

## 2014-11-15 DIAGNOSIS — L821 Other seborrheic keratosis: Secondary | ICD-10-CM | POA: Diagnosis not present

## 2014-11-15 DIAGNOSIS — L658 Other specified nonscarring hair loss: Secondary | ICD-10-CM | POA: Diagnosis not present

## 2014-11-15 DIAGNOSIS — I359 Nonrheumatic aortic valve disorder, unspecified: Secondary | ICD-10-CM | POA: Diagnosis not present

## 2014-11-15 DIAGNOSIS — E119 Type 2 diabetes mellitus without complications: Secondary | ICD-10-CM | POA: Diagnosis not present

## 2014-12-11 DIAGNOSIS — N816 Rectocele: Secondary | ICD-10-CM | POA: Diagnosis not present

## 2014-12-11 DIAGNOSIS — Z4689 Encounter for fitting and adjustment of other specified devices: Secondary | ICD-10-CM | POA: Diagnosis not present

## 2014-12-11 DIAGNOSIS — N8110 Cystocele, unspecified: Secondary | ICD-10-CM | POA: Diagnosis not present

## 2014-12-11 DIAGNOSIS — N812 Incomplete uterovaginal prolapse: Secondary | ICD-10-CM | POA: Diagnosis not present

## 2014-12-11 DIAGNOSIS — S30814A Abrasion of vagina and vulva, initial encounter: Secondary | ICD-10-CM | POA: Diagnosis not present

## 2014-12-20 ENCOUNTER — Ambulatory Visit (INDEPENDENT_AMBULATORY_CARE_PROVIDER_SITE_OTHER): Payer: Medicare Other | Admitting: Obstetrics and Gynecology

## 2014-12-20 ENCOUNTER — Encounter: Payer: Self-pay | Admitting: Obstetrics and Gynecology

## 2014-12-20 VITALS — BP 170/84 | HR 88 | Ht 60.0 in | Wt 146.5 lb

## 2014-12-20 DIAGNOSIS — N898 Other specified noninflammatory disorders of vagina: Secondary | ICD-10-CM

## 2014-12-20 DIAGNOSIS — N814 Uterovaginal prolapse, unspecified: Secondary | ICD-10-CM

## 2014-12-20 DIAGNOSIS — T8389XD Other specified complication of genitourinary prosthetic devices, implants and grafts, subsequent encounter: Secondary | ICD-10-CM

## 2014-12-20 DIAGNOSIS — IMO0002 Reserved for concepts with insufficient information to code with codable children: Secondary | ICD-10-CM

## 2014-12-20 DIAGNOSIS — N8111 Cystocele, midline: Secondary | ICD-10-CM | POA: Insufficient documentation

## 2014-12-20 DIAGNOSIS — N8110 Cystocele, unspecified: Secondary | ICD-10-CM | POA: Diagnosis not present

## 2014-12-20 DIAGNOSIS — T8389XA Other specified complication of genitourinary prosthetic devices, implants and grafts, initial encounter: Secondary | ICD-10-CM

## 2014-12-20 DIAGNOSIS — N816 Rectocele: Secondary | ICD-10-CM

## 2014-12-20 NOTE — Assessment & Plan Note (Signed)
[id number],hg

## 2014-12-20 NOTE — Patient Instructions (Signed)
Plan:  1.  Gelhorn  pessary inserted 2.  Premarin cream intravaginally biweekly. 3.  RTC 6 weeks for recheck

## 2014-12-20 NOTE — Progress Notes (Signed)
Subjective:     Patient ID: Tonya Atkinson, female   DOB: Nov 29, 1927, 81 y.o.   MRN: [id number]  HPIChief complaint: 1.  Vaginal erosion. 2.  Pelvic organ prolapse. 3.  Pessary maintenance.  Patient presents for 2 week follow-up after diagnosis of recurrent vaginal erosion.  Patient has been using Premarin vaginal cream 3 times a week.    Review of Systems  Genitourinary:       Pelvic pressure is worse without pessary  All other systems reviewed and are negative.     Objective:   Physical Exam BP 170/84 mmHg  Pulse 88  Ht 5' (1.524 m)  Wt 146 lb 8 oz (66.452 kg)  BMI 28.61 kg/m2 General: Well-appearing elderly female in no acute distress.  Alert and oriented. Abdomen: Soft, nontender, no organomegaly. Pelvic: External genitalia-normal. BUS: Normal Vagina: Normal-appearing mucosa; erosion, resolved. Prolapse persists. Cervix: Parous, no lesions Uterus: Normal size and shape, nontender RV: Deferred    Assessment:     1.  Vaginal erosion, resolved. 2.  Pelvic organ prolapse, symptomatic     Plan:  1.  Gelhorn  pessary inserted 2.  Premarin cream intravaginally biweekly. 3.  RTC 6 weeks for recheck

## 2015-01-04 DIAGNOSIS — T360X5S Adverse effect of penicillins, sequela: Secondary | ICD-10-CM | POA: Diagnosis not present

## 2015-01-04 DIAGNOSIS — E119 Type 2 diabetes mellitus without complications: Secondary | ICD-10-CM | POA: Diagnosis not present

## 2015-01-04 DIAGNOSIS — L658 Other specified nonscarring hair loss: Secondary | ICD-10-CM | POA: Diagnosis not present

## 2015-01-31 ENCOUNTER — Encounter: Payer: Self-pay | Admitting: Obstetrics and Gynecology

## 2015-01-31 ENCOUNTER — Ambulatory Visit (INDEPENDENT_AMBULATORY_CARE_PROVIDER_SITE_OTHER): Payer: Medicare Other | Admitting: Obstetrics and Gynecology

## 2015-01-31 VITALS — BP 150/82 | HR 80 | Ht 60.0 in | Wt 151.6 lb

## 2015-01-31 DIAGNOSIS — N898 Other specified noninflammatory disorders of vagina: Secondary | ICD-10-CM | POA: Diagnosis not present

## 2015-01-31 DIAGNOSIS — Z4689 Encounter for fitting and adjustment of other specified devices: Secondary | ICD-10-CM

## 2015-01-31 NOTE — Progress Notes (Signed)
Patient ID: Tonya Atkinson, female   DOB: 1927/08/05, 81 y.o.   MRN: [id number]   HPI Chief complaint: 1 Pelvic organ prolapse. 2. Pessary maintenance. 3.  History of vaginal erosion   6 week pessary check Premarin 2-3 x q week No vd, odor, bleeding, pain    Review of Systems  Genitourinary:  All other systems reviewed and are negative.     Objective:   Physical Exam BP 170/84 mmHg  Pulse 88  Ht 5' (1.524 m)  Wt 146 lb 8 oz (66.452 kg)  BMI 28.61 kg/m2 General: Well-appearing elderly female in no acute distress. Alert and oriented. Abdomen: Soft, nontender, no organomegaly. Pelvic: External genitalia-normal. BUS: Normal Vagina: Normal-appearing mucosa; erosion, posterior vagina, minimal. Prolapse persists. Cervix: Parous, no lesions Uterus: Normal size and shape, nontender RV: Deferred    Assessment:     1. Vaginal erosion, minimal.  On posterior vagina. 2. Pelvic organ prolapse, Asymptomatic with pessary 3.  Pessary maintenance    Plan:  1. Gelhorn pessary removed, cleaned, reinserted 2. Premarin cream intravaginally biweekly. 3. RTC 6 weeks for recheck or as needed if vaginal bleeding, discharge, pain developsl.

## 2015-03-06 DIAGNOSIS — L821 Other seborrheic keratosis: Secondary | ICD-10-CM | POA: Diagnosis not present

## 2015-03-06 DIAGNOSIS — N814 Uterovaginal prolapse, unspecified: Secondary | ICD-10-CM | POA: Diagnosis not present

## 2015-03-06 DIAGNOSIS — E119 Type 2 diabetes mellitus without complications: Secondary | ICD-10-CM | POA: Diagnosis not present

## 2015-03-06 DIAGNOSIS — I10 Essential (primary) hypertension: Secondary | ICD-10-CM | POA: Diagnosis not present

## 2015-03-14 ENCOUNTER — Encounter: Payer: Self-pay | Admitting: Obstetrics and Gynecology

## 2015-03-14 ENCOUNTER — Ambulatory Visit (INDEPENDENT_AMBULATORY_CARE_PROVIDER_SITE_OTHER): Payer: Medicare Other | Admitting: Obstetrics and Gynecology

## 2015-03-14 VITALS — BP 138/80 | HR 96 | Ht 60.0 in | Wt 150.1 lb

## 2015-03-14 DIAGNOSIS — K219 Gastro-esophageal reflux disease without esophagitis: Secondary | ICD-10-CM

## 2015-03-14 DIAGNOSIS — N816 Rectocele: Secondary | ICD-10-CM | POA: Diagnosis not present

## 2015-03-14 DIAGNOSIS — I10 Essential (primary) hypertension: Secondary | ICD-10-CM | POA: Diagnosis not present

## 2015-03-14 DIAGNOSIS — T8389XD Other specified complication of genitourinary prosthetic devices, implants and grafts, subsequent encounter: Secondary | ICD-10-CM

## 2015-03-14 DIAGNOSIS — N8110 Cystocele, unspecified: Secondary | ICD-10-CM

## 2015-03-14 DIAGNOSIS — N814 Uterovaginal prolapse, unspecified: Secondary | ICD-10-CM

## 2015-03-14 DIAGNOSIS — Z4689 Encounter for fitting and adjustment of other specified devices: Secondary | ICD-10-CM

## 2015-03-14 DIAGNOSIS — IMO0002 Reserved for concepts with insufficient information to code with codable children: Secondary | ICD-10-CM

## 2015-03-14 DIAGNOSIS — N898 Other specified noninflammatory disorders of vagina: Secondary | ICD-10-CM

## 2015-03-14 NOTE — Progress Notes (Signed)
Patient ID: Tonya Atkinson, female   DOB: 09/02/1927, 81 y.o.   MRN: [id number]   Chief complaint: 1.  Cystocele. 2.  Rectocele. 3.  Uterine prolapse.   6 wk pessary check. Last visit 01/31/2015 pt had min posterior vaginal erosion.  No vaginal discharge, no odor, no vaginal bleeding, no pain.  Patient is using Premarin cream twice weekly  Past medical history, past surgical history, problem list, medications, allergies, reviewed and updated.  Review of Systems  Constitutional: Negative.   Gastrointestinal: Negative.   Genitourinary: Negative.   Musculoskeletal: Negative.   Skin: Negative.    OBJECTIVE: BP 138/80 mmHg  Pulse 96  Ht 5' (1.524 m)  Wt 150 lb 1 oz (68.068 kg)  BMI 29.31 kg/m2 Pleasant elderly female in no acute distress. Abdomen: Soft, nontender, no organomegaly. Pelvic exam: External genitalia within normal BUS-normal. Vagina-prolapse present; posterior vaginal mucosa with slight erosion persisting without evidence of infection; no discharge; no odor. Cervix-posterior cervical polyp, 7 mm stable; No lesions; No cervical motion tenderness Uterus-midplane, mobile, nontender. Adnexa-nonpalpable, nontender. Rectovaginal-normal.  External exam  Procedure:Gellhorn Pessary is removed, cleaned, and replaced.  IMPRESSION: 1.  Cystocele. 2.  Rectocele. 3.  Uterine prolapse. 4.  Posterior vaginal erosion, stable. 5.  Normal pessary check.  PLAN: 1.  Pessary is removed, cleaned and replaced. 2.  Return in 6 weeks for recheck. 3.  Continue with Premarin cream, intravaginal biweekly  A total of 15 minutes were spent face-to-face with the patient during this encounter and over half of that time dealt with counseling and coordination of care.  Brayton Mars, MD

## 2015-03-14 NOTE — Patient Instructions (Signed)
1.  Continue Premarin cream intravaginally twice a week. 2.  Return in 6 weeks for recheck

## 2015-04-03 DIAGNOSIS — E11329 Type 2 diabetes mellitus with mild nonproliferative diabetic retinopathy without macular edema: Secondary | ICD-10-CM | POA: Diagnosis not present

## 2015-04-10 DIAGNOSIS — E119 Type 2 diabetes mellitus without complications: Secondary | ICD-10-CM | POA: Diagnosis not present

## 2015-04-10 DIAGNOSIS — I10 Essential (primary) hypertension: Secondary | ICD-10-CM | POA: Diagnosis not present

## 2015-04-10 DIAGNOSIS — L659 Nonscarring hair loss, unspecified: Secondary | ICD-10-CM | POA: Diagnosis not present

## 2015-04-10 DIAGNOSIS — L821 Other seborrheic keratosis: Secondary | ICD-10-CM | POA: Diagnosis not present

## 2015-04-11 DIAGNOSIS — Z23 Encounter for immunization: Secondary | ICD-10-CM | POA: Diagnosis not present

## 2015-04-25 ENCOUNTER — Ambulatory Visit (INDEPENDENT_AMBULATORY_CARE_PROVIDER_SITE_OTHER): Payer: Medicare Other | Admitting: Obstetrics and Gynecology

## 2015-04-25 ENCOUNTER — Encounter: Payer: Self-pay | Admitting: Obstetrics and Gynecology

## 2015-04-25 VITALS — BP 142/78 | HR 94 | Ht 60.0 in | Wt 151.4 lb

## 2015-04-25 DIAGNOSIS — Z4689 Encounter for fitting and adjustment of other specified devices: Secondary | ICD-10-CM

## 2015-04-25 DIAGNOSIS — IMO0002 Reserved for concepts with insufficient information to code with codable children: Secondary | ICD-10-CM

## 2015-04-25 DIAGNOSIS — N816 Rectocele: Secondary | ICD-10-CM | POA: Diagnosis not present

## 2015-04-25 DIAGNOSIS — N8110 Cystocele, unspecified: Secondary | ICD-10-CM

## 2015-04-25 NOTE — Patient Instructions (Addendum)
1.  Return in 6 weeks for pessary maintenance. 2.  Continue with Premarin cream biweekly

## 2015-04-25 NOTE — Progress Notes (Signed)
Patient ID: Tonya Atkinson, female   DOB: 12-22-1927, 81 y.o.   MRN: [id number]   Chief complaint: 1. Cystocele. 2. Rectocele. 3. Uterine prolapse.   6 wk pessary check. Last visit 03/14/2015 pt had min posterior vaginal erosion. No vaginal discharge, no odor, no vaginal bleeding, no pain.  Patient is using Premarin cream twice weekly  Past medical history, past surgical history, problem list, medications, allergies, reviewed and updated.  Review of Systems  Constitutional: Negative.  Gastrointestinal: Negative.  Genitourinary: Negative.  Musculoskeletal: Negative.  Skin: Negative.   OBJECTIVE: BP 138/80 mmHg  Pulse 96  Ht 5' (1.524 m)  Wt 150 lb 1 oz (68.068 kg)  BMI 29.31 kg/m2 Pleasant elderly female in no acute distress. Abdomen: Soft, nontender, no organomegaly. Pelvic exam: External genitalia within normal BUS-normal. Vagina-prolapse present; posterior vaginal mucosa with slight erosion persisting without evidence of infection; no discharge; no odor. Cervix-posterior cervical polyp, 7 mm stable; No lesions; No cervical motion tenderness Uterus-midplane, mobile, nontender. Adnexa-nonpalpable, nontender. Rectovaginal-normal. External exam  Procedure:Gellhorn Pessary is removed, cleaned, and replaced.  IMPRESSION: 1. Cystocele. 2. Rectocele. 3. Uterine prolapse. 4. Posterior vaginal erosion, stable. 5. Normal pessary check.  PLAN: 1. Pessary is removed, cleaned and replaced. 2. Return in 6 weeks for recheck. 3. Continue with Premarin cream, intravaginal biweekly  A total of 15 minutes were spent face-to-face with the patient during this encounter and over half of that time dealt with counseling and coordination of care.  Brayton Mars, MD

## 2015-06-06 ENCOUNTER — Encounter: Payer: Self-pay | Admitting: Obstetrics and Gynecology

## 2015-06-06 ENCOUNTER — Ambulatory Visit (INDEPENDENT_AMBULATORY_CARE_PROVIDER_SITE_OTHER): Payer: Medicare Other | Admitting: Obstetrics and Gynecology

## 2015-06-06 VITALS — BP 149/87 | HR 108 | Ht 60.0 in | Wt 155.3 lb

## 2015-06-06 DIAGNOSIS — N816 Rectocele: Secondary | ICD-10-CM

## 2015-06-06 DIAGNOSIS — N898 Other specified noninflammatory disorders of vagina: Secondary | ICD-10-CM | POA: Diagnosis not present

## 2015-06-06 DIAGNOSIS — N8110 Cystocele, unspecified: Secondary | ICD-10-CM

## 2015-06-06 DIAGNOSIS — Z4689 Encounter for fitting and adjustment of other specified devices: Secondary | ICD-10-CM | POA: Diagnosis not present

## 2015-06-06 DIAGNOSIS — IMO0002 Reserved for concepts with insufficient information to code with codable children: Secondary | ICD-10-CM

## 2015-06-06 NOTE — Patient Instructions (Addendum)
1. Use the estrogen cream in the vagina every other day for 6 days. 2. Return in 6 days for pessary insertion 3. The vaginal erosion is worse today; pessary needs to be kept out  For at least 1 week.

## 2015-06-06 NOTE — Progress Notes (Signed)
Patient ID: Tonya Atkinson, female   DOB: 09/14/1927, 81 y.o.   MRN: [id number]  Chief complaint: 1. Pessary maintenance6 wk pessary check No vaginal bleeding, no discharge, no odor. B&B-urination good, a little constipation relieved with eating fruit. Using Premarin Cream 2 week. Last visit 04/25/15, posterior vag. Erosion, stable last visit.   Past medical history, past surgical history, problem list, medications, and allergies are reviewed  Review of systems: Per history of present illness  OBJECTIVE: BP 149/87 mmHg  Pulse 108  Ht 5' (1.524 m)  Wt 155 lb 5 oz (70.449 kg)  BMI 30.33 kg/m2 Pleasant elderly female in no acute distress. She is alert and oriented. Abdomen: Soft, nontender Pelvic exam: External genitalia within normal BUS-normal. Vagina-prolapse present; posterior vaginal mucosa with moderate erosion Progressing without evidence of infection; mild to moderate bloody discharge; no odor. Cervix-posterior cervical polyp, 7 mm stable; No lesions; No cervical motion tenderness Uterus-midplane, mobile, nontender. Adnexa-nonpalpable, nontender. Rectovaginal-normal. External exam  Procedure:Gellhorn Pessary is removed, cleaned, and not reinserted.  IMPRESSION: 1. Cystocele. 2. Rectocele. 3. Uterine prolapse. 4. Posterior vaginal erosion, progressed.  PLAN: 1. Leave pessary out for 1 week 2. Use Premarin cream every other day 3. Return in 1 week for pessary insertion  A total of 15 minutes were spent face-to-face with the patient during this encounter and over half of that time dealt with counseling and coordination of care.  Brayton Mars, MD  Note: This dictation was prepared with Dragon dictation along with smaller phrase technology. Any transcriptional errors that result from this process are unintentional.

## 2015-06-12 ENCOUNTER — Ambulatory Visit (INDEPENDENT_AMBULATORY_CARE_PROVIDER_SITE_OTHER): Payer: Medicare Other | Admitting: Obstetrics and Gynecology

## 2015-06-12 ENCOUNTER — Encounter: Payer: Self-pay | Admitting: Obstetrics and Gynecology

## 2015-06-12 VITALS — BP 135/81 | HR 109 | Ht 60.0 in | Wt 152.3 lb

## 2015-06-12 DIAGNOSIS — N8110 Cystocele, unspecified: Secondary | ICD-10-CM | POA: Diagnosis not present

## 2015-06-12 DIAGNOSIS — N898 Other specified noninflammatory disorders of vagina: Secondary | ICD-10-CM | POA: Diagnosis not present

## 2015-06-12 DIAGNOSIS — Z4689 Encounter for fitting and adjustment of other specified devices: Secondary | ICD-10-CM

## 2015-06-12 DIAGNOSIS — N814 Uterovaginal prolapse, unspecified: Secondary | ICD-10-CM | POA: Diagnosis not present

## 2015-06-12 DIAGNOSIS — N889 Noninflammatory disorder of cervix uteri, unspecified: Secondary | ICD-10-CM | POA: Diagnosis not present

## 2015-06-12 DIAGNOSIS — T8389XD Other specified complication of genitourinary prosthetic devices, implants and grafts, subsequent encounter: Secondary | ICD-10-CM | POA: Diagnosis not present

## 2015-06-12 DIAGNOSIS — IMO0002 Reserved for concepts with insufficient information to code with codable children: Secondary | ICD-10-CM

## 2015-06-12 DIAGNOSIS — T8389XA Other specified complication of genitourinary prosthetic devices, implants and grafts, initial encounter: Secondary | ICD-10-CM

## 2015-06-12 NOTE — Patient Instructions (Signed)
1.  Pessary is inserted today. 2.  Return in 6 weeks for pessary check. 3.  Continue using TRIMO SAN gel weekly

## 2015-06-12 NOTE — Progress Notes (Signed)
Patient ID: Tonya Atkinson, female   DOB: May 28, 1928, 81 y.o.   MRN: [id number] gellhorn pessary insertion  using premarin cream qod  Chief complaint: 1.  Follow-up on vaginal erosion. 2.  Pelvic organ prolapse (cystocele, rectocele, uterine prolapse).  Pessary has been let for 1 week.  She is using Premarin cream every other night to help with  Vaginal erosion healing.  She does not report any significant vaginal bleeding or  Discharge.  Past medical history, past surgical history, problem list, medications, and allergies are reviewed.  Review of systems: Per HPI.Marland Kitchen  OBJECTIVE: BP 135/81 mmHg  Pulse 109  Ht 5' (1.524 m)  Wt 152 lb 4.8 oz (69.083 kg)  BMI 29.74 kg/m2 Frail, elderly female in no acute distress.   Pelvic exam: External genitalia-normal BUS-normal. Vagina-third degree cystocele; moderate rectocele; posterior vaginal erosion, partially healed Cervix-2 cm x 1.5 cm lesion at 12:00 on the ectocervix.-Mild leukoplakia. Uterus-normal size and shape, nontender Adnexa-no masses or tenderness. Rectovaginal-normal external exam.  ASSESSMENT: 1.  Healing posterior vaginal erosion. 2.  1 cm anterior cervix,  Leukoplakia lesion, will need biopsy in near future 3.  Pelvic organ prolapse,, very symptomatic, without pessary in place.  PLAN: 1.  Insert gel horn pessary. 2.  Return in 6 weeks for pessary maintenance. 3.  Probable cervical biopsy at next visit of anterior cervix Leukoplakia lesion  Brayton Mars, MD  Note: This dictation was prepared with Dragon dictation along with smaller phrase technology. Any transcriptional errors that result from this process are unintentional.

## 2015-07-10 DIAGNOSIS — L658 Other specified nonscarring hair loss: Secondary | ICD-10-CM | POA: Diagnosis not present

## 2015-07-10 DIAGNOSIS — E119 Type 2 diabetes mellitus without complications: Secondary | ICD-10-CM | POA: Diagnosis not present

## 2015-07-10 DIAGNOSIS — L821 Other seborrheic keratosis: Secondary | ICD-10-CM | POA: Diagnosis not present

## 2015-07-10 DIAGNOSIS — G4700 Insomnia, unspecified: Secondary | ICD-10-CM | POA: Diagnosis not present

## 2015-07-24 ENCOUNTER — Ambulatory Visit (INDEPENDENT_AMBULATORY_CARE_PROVIDER_SITE_OTHER): Payer: Medicare Other | Admitting: Obstetrics and Gynecology

## 2015-07-24 ENCOUNTER — Encounter: Payer: Self-pay | Admitting: Obstetrics and Gynecology

## 2015-07-24 VITALS — BP 152/84 | HR 94 | Wt 156.3 lb

## 2015-07-24 DIAGNOSIS — N8110 Cystocele, unspecified: Secondary | ICD-10-CM | POA: Diagnosis not present

## 2015-07-24 DIAGNOSIS — Z4689 Encounter for fitting and adjustment of other specified devices: Secondary | ICD-10-CM | POA: Diagnosis not present

## 2015-07-24 DIAGNOSIS — N816 Rectocele: Secondary | ICD-10-CM

## 2015-07-24 DIAGNOSIS — N814 Uterovaginal prolapse, unspecified: Secondary | ICD-10-CM | POA: Diagnosis not present

## 2015-07-24 DIAGNOSIS — N898 Other specified noninflammatory disorders of vagina: Secondary | ICD-10-CM

## 2015-07-24 DIAGNOSIS — IMO0002 Reserved for concepts with insufficient information to code with codable children: Secondary | ICD-10-CM

## 2015-07-24 DIAGNOSIS — T8389XD Other specified complication of genitourinary prosthetic devices, implants and grafts, subsequent encounter: Secondary | ICD-10-CM | POA: Diagnosis not present

## 2015-07-24 NOTE — Progress Notes (Signed)
Chief complaint: 1.  Pessary maintenance. 2.  Cystocele, rectocele, uterine prolapse.  Patient presents for 6 week pessary maintenance evaluation.  She is using Premarin cream intravaginal twice a week.  Prior to last evaluation, she had a posterior vaginal erosion which was managed with pessary for a holiday for 10 days. Patient does not report any vaginal discharge, vaginal bleeding, or vaginal odor.  Past medical history, past surgical history, problem list, medications, and allergies are reviewed.  Review of systems: Per HPI.  OBJECTIVE: BP 152/84 mmHg  Pulse 94  Wt 156 lb 5 oz (70.903 kg) Frail, elderly female in no acute distress.  Pelvic exam: External genitalia-normal BUS-normal. Vagina-third degree cystocele; moderate rectocele; posterior vaginal erosion With slight friability and no evidence of infection Cervix-No lesions Uterus-normal size and shape, nontender Adnexa-no masses or tenderness. Rectovaginal-normal external exam.  ASSESSMENT: 1.  Chronic posterior vaginal erosion, friable, stable. 2.  Pelvic organ prolapse, very symptomatic without pessary in place.  PROCEDURE: Remove, clean, and reinsert pessary.  Minimal bright red blood from posterior erosion was swabbed away.  PLAN: 1.  Remove clean and reinsert Gellhorn pessary. 2.  Continue with Premarin cream intravaginally twice a week. 3.  Return in 6 weeks for pessary maintenance. 4.  Previously noted leukoplakia on cervix was not noted today and therefore no biopsy was performed.  A total of 15 minutes were spent face-to-face with the patient during this encounter and over half of that time dealt with counseling and coordination of care.  Brayton Mars, MD  Note: This dictation was prepared with Dragon dictation along with smaller phrase technology. Any transcriptional errors that result from this process are unintentional.

## 2015-08-16 DIAGNOSIS — L989 Disorder of the skin and subcutaneous tissue, unspecified: Secondary | ICD-10-CM | POA: Diagnosis not present

## 2015-08-19 ENCOUNTER — Ambulatory Visit (INDEPENDENT_AMBULATORY_CARE_PROVIDER_SITE_OTHER): Payer: Medicare Other | Admitting: General Surgery

## 2015-08-19 ENCOUNTER — Encounter: Payer: Self-pay | Admitting: General Surgery

## 2015-08-19 VITALS — BP 130/80 | HR 80 | Resp 16 | Ht 64.0 in | Wt 154.0 lb

## 2015-08-19 DIAGNOSIS — L989 Disorder of the skin and subcutaneous tissue, unspecified: Secondary | ICD-10-CM

## 2015-08-19 NOTE — Patient Instructions (Signed)
Return in 2-3 weeks

## 2015-08-19 NOTE — Progress Notes (Signed)
Patient ID: Tonya Atkinson, female   DOB: 07-07-1928, 82 y.o.   MRN: [id number]  Chief Complaint  Patient presents with  . Other    rigth arm lesion     HPI [name] is a 82 y.o. female here today for an evaluation right arm lesion.  Patient states she noticed this about three weeks ago. The area was red and swollen Dr. Lavera Guise on Friday done incision and drainage.  I have reviewed the history of present illness with the patient.  HPI  Past Medical History  Diagnosis Date  . Vaginal erosion     secondary to pessary use  . Diabetes mellitus without complication ([place])   . Hypertension   . Cystocele   . Rectocele   . Cystocele   . Vaginal abrasion   . Vaginal atrophy   . Cervical polyp   . Uterine prolapse   . Procidentia of uterus     Past Surgical History  Procedure Laterality Date  . Tubal ligation      Family History  Problem Relation Age of Onset  . Cancer Neg Hx     Social History Social History  Substance Use Topics  . Smoking status: Never Smoker   . Smokeless tobacco: None  . Alcohol Use: No    Allergies  Allergen Reactions  . Penicillins     Current Outpatient Prescriptions  Medication Sig Dispense Refill  . conjugated estrogens (PREMARIN) vaginal cream Place 0.625 g vaginally daily.    . [employer] CHEST CONGESTION RELIEF 400 MG TABS tablet Take 400 mg by mouth 2 (two) times daily.  6  . [employer] VITAMIN D3 1000 UNITS capsule Take 1,000 Units by mouth daily.  5  . enalapril (VASOTEC) 2.5 MG tablet Take 2.5 mg by mouth daily.  3  . hydrochlorothiazide (HYDRODIURIL) 25 MG tablet Take 25 mg by mouth daily.  3  . KLOR-CON M20 20 MEQ tablet Take 20 mEq by mouth 2 (two) times daily.  3  . loratadine (CLARITIN) 10 MG tablet Take 10 mg by mouth daily.  4  . metFORMIN (GLUCOPHAGE) 500 MG tablet Take 500 mg by mouth 2 (two) times daily.  3  . omeprazole (PRILOSEC) 20 MG capsule   3  . triamcinolone cream (KENALOG) 0.1 % APPLY CREAM TO AFFECTED AREA TWO TIMES DAILY   3   No current facility-administered medications for this visit.    Review of Systems Review of Systems  Constitutional: Negative.   Respiratory: Negative.   Cardiovascular: Negative.     Blood pressure 130/80, pulse 80, resp. rate 16, height 5\' 4"  (1.626 m), weight 154 lb (69.854 kg).  Physical Exam Physical Exam  Constitutional: She is oriented to person, place, and time. She appears well-developed and well-nourished.  Cardiovascular: Normal rate and regular rhythm.   Neurological: She is alert and oriented to person, place, and time.  Skin: Skin is warm and dry.       Data Reviewed Notes reviewed  Assessment    Right arm lesion. By history, there was an abscess or blister that was drained. Likely this will resolve in time.     Plan    Return in 2-3 weeks    PCP:  Cletis [place] /This information has been scribed by Gaspar Cola CMA.    Emmerson Shuffield G 08/20/2015, 11:35 AM

## 2015-08-20 ENCOUNTER — Encounter: Payer: Self-pay | Admitting: General Surgery

## 2015-08-21 DIAGNOSIS — L658 Other specified nonscarring hair loss: Secondary | ICD-10-CM | POA: Diagnosis not present

## 2015-08-21 DIAGNOSIS — N814 Uterovaginal prolapse, unspecified: Secondary | ICD-10-CM | POA: Diagnosis not present

## 2015-08-21 DIAGNOSIS — L98499 Non-pressure chronic ulcer of skin of other sites with unspecified severity: Secondary | ICD-10-CM | POA: Diagnosis not present

## 2015-08-21 DIAGNOSIS — L821 Other seborrheic keratosis: Secondary | ICD-10-CM | POA: Diagnosis not present

## 2015-09-02 ENCOUNTER — Encounter: Payer: Self-pay | Admitting: General Surgery

## 2015-09-02 ENCOUNTER — Ambulatory Visit (INDEPENDENT_AMBULATORY_CARE_PROVIDER_SITE_OTHER): Payer: Medicare Other | Admitting: General Surgery

## 2015-09-02 VITALS — BP 140/70 | HR 68 | Resp 14 | Ht 64.0 in | Wt 154.0 lb

## 2015-09-02 DIAGNOSIS — L989 Disorder of the skin and subcutaneous tissue, unspecified: Secondary | ICD-10-CM | POA: Diagnosis not present

## 2015-09-02 DIAGNOSIS — C44622 Squamous cell carcinoma of skin of right upper limb, including shoulder: Secondary | ICD-10-CM | POA: Diagnosis not present

## 2015-09-02 NOTE — Patient Instructions (Signed)
Follow up appointment to be announced.  

## 2015-09-02 NOTE — Progress Notes (Signed)
This a 82 year old female here today following up from her two week left arm skin lesion. Patient states the area is doing well. I have reviewed the history of present illness with the patient.  On exam today the nodular area in right forearm has increased to 3cm size. Still with no open areas or drainage.  The process is confined to ski, no fxity to deeper structures. With consent punch biopsy was completed. Area was prepped, 60ml local 1% xylocaine was instilled. Punch biopsy was completed, base cauterized. Dressed with neosporin and bandaid.   Await path result PCP:  Cletis [place] This information has been scribed by Gaspar Cola CMA.

## 2015-09-03 DIAGNOSIS — N814 Uterovaginal prolapse, unspecified: Secondary | ICD-10-CM | POA: Diagnosis not present

## 2015-09-03 DIAGNOSIS — L658 Other specified nonscarring hair loss: Secondary | ICD-10-CM | POA: Diagnosis not present

## 2015-09-03 DIAGNOSIS — L821 Other seborrheic keratosis: Secondary | ICD-10-CM | POA: Diagnosis not present

## 2015-09-03 DIAGNOSIS — T360X5S Adverse effect of penicillins, sequela: Secondary | ICD-10-CM | POA: Diagnosis not present

## 2015-09-04 ENCOUNTER — Encounter: Payer: Self-pay | Admitting: Obstetrics and Gynecology

## 2015-09-04 ENCOUNTER — Ambulatory Visit (INDEPENDENT_AMBULATORY_CARE_PROVIDER_SITE_OTHER): Payer: Medicare Other | Admitting: Obstetrics and Gynecology

## 2015-09-04 VITALS — BP 142/79 | HR 116 | Wt 157.6 lb

## 2015-09-04 DIAGNOSIS — Z4689 Encounter for fitting and adjustment of other specified devices: Secondary | ICD-10-CM | POA: Diagnosis not present

## 2015-09-04 DIAGNOSIS — N8110 Cystocele, unspecified: Secondary | ICD-10-CM

## 2015-09-04 DIAGNOSIS — IMO0002 Reserved for concepts with insufficient information to code with codable children: Secondary | ICD-10-CM

## 2015-09-04 DIAGNOSIS — T8389XD Other specified complication of genitourinary prosthetic devices, implants and grafts, subsequent encounter: Secondary | ICD-10-CM | POA: Diagnosis not present

## 2015-09-04 DIAGNOSIS — N814 Uterovaginal prolapse, unspecified: Secondary | ICD-10-CM | POA: Diagnosis not present

## 2015-09-04 DIAGNOSIS — N816 Rectocele: Secondary | ICD-10-CM | POA: Diagnosis not present

## 2015-09-04 DIAGNOSIS — N898 Other specified noninflammatory disorders of vagina: Secondary | ICD-10-CM

## 2015-09-04 NOTE — Patient Instructions (Signed)
1.  Continue using Premarin cream twice a week in the vagina. 2.  Return in 6 weeks for pessary follow-up

## 2015-09-04 NOTE — Progress Notes (Signed)
Patient ID: Tonya Atkinson, female   DOB: 1927/12/31, 82 y.o.   MRN: [id number] Chief complaint: 1. Pessary maintenance. 2. Cystocele, rectocele, uterine prolapse.  Patient presents for 6 week pessary maintenance evaluation. She is using Premarin cream intravaginal twice a week. History of  a posterior vaginal erosion which was managed with pessary holiday for 10 days. Patient does not report any vaginal discharge, vaginal bleeding, or vaginal odor.  New diagnosis of skin cancer on right wrist, to be managed by Dr. Jamal Collin  Past medical history, past surgical history, problem list, medications, and allergies are reviewed.  Review of systems: Per HPI.  OBJECTIVE: BP 142/79 mmHg  Pulse 116  Wt 157 lb 9 oz (71.47 kg)  Frail, elderly female in no acute distress.  Pelvic exam: External genitalia-normal BUS-normal. Vagina-third degree cystocele; moderate rectocele; posterior vaginal erosion With slight friability and no evidence of infection Cervix-No lesions Uterus-normal size and shape, nontender Adnexa-no masses or tenderness. Rectovaginal-normal external exam.  ASSESSMENT: 1. Chronic posterior vaginal erosion, friable, stable. 2. Pelvic organ prolapse, very symptomatic without pessary in place.  PROCEDURE: Remove, clean, and reinsert pessary. Minimal bright red blood from posterior erosion was swabbed away.  PLAN: 1. Remove clean and reinsert Gellhorn pessary. 2. Continue with Premarin cream intravaginally twice a week. 3. Return in 6 weeks for pessary maintenance.  A total of 15 minutes were spent face-to-face with the patient during this encounter and over half of that time dealt with counseling and coordination of care.  Brayton Mars, MD  Note: This dictation was prepared with Dragon dictation along with smaller phrase technology. Any transcriptional errors that result from this process are unintentional.

## 2015-09-12 ENCOUNTER — Encounter: Payer: Self-pay | Admitting: General Surgery

## 2015-09-12 ENCOUNTER — Ambulatory Visit (INDEPENDENT_AMBULATORY_CARE_PROVIDER_SITE_OTHER): Payer: Medicare Other | Admitting: General Surgery

## 2015-09-12 VITALS — BP 156/76 | HR 66 | Resp 12 | Ht 64.0 in | Wt 154.0 lb

## 2015-09-12 DIAGNOSIS — C44622 Squamous cell carcinoma of skin of right upper limb, including shoulder: Secondary | ICD-10-CM

## 2015-09-12 NOTE — Progress Notes (Signed)
Patient ID: Tonya Atkinson, female   DOB: 01/11/1928, 82 y.o.   MRN: [id number]  Chief Complaint  Patient presents with  . Procedure    right arm excision    HPI Tonya Atkinson is a 82 y.o. female here today for excision right forearm skin lesion. Punch biopsy showed SCC. Pt was advised o n this and excision recommended. Pt agreeed.  HPI I have reviewed the history of present illness with the patient.  Past Medical History  Diagnosis Date  . Vaginal erosion     secondary to pessary use  . Diabetes mellitus without complication ([place])   . Hypertension   . Cystocele   . Rectocele   . Cystocele   . Vaginal abrasion   . Vaginal atrophy   . Cervical polyp   . Uterine prolapse   . Procidentia of uterus     Past Surgical History  Procedure Laterality Date  . Tubal ligation      Family History  Problem Relation Age of Onset  . Cancer Neg Hx     Social History Social History  Substance Use Topics  . Smoking status: Never Smoker   . Smokeless tobacco: None  . Alcohol Use: No    Allergies  Allergen Reactions  . Penicillins     Current Outpatient Prescriptions  Medication Sig Dispense Refill  . conjugated estrogens (PREMARIN) vaginal cream Place 0.625 g vaginally daily.    . CVS CHEST CONGESTION RELIEF 400 MG TABS tablet Take 400 mg by mouth 2 (two) times daily.  6  . [employer]S VITAMIN D3 1000 UNITS capsule Take 1,000 Units by mouth daily.  5  . enalapril (VASOTEC) 2.5 MG tablet Take 2.5 mg by mouth daily.  3  . hydrochlorothiazide (HYDRODIURIL) 25 MG tablet Take 25 mg by mouth daily.  3  . KLOR-CON M20 20 MEQ tablet Take 20 mEq by mouth 2 (two) times daily.  3  . levofloxacin (LEVAQUIN) 500 MG tablet Take 500 mg by mouth daily.  0  . loratadine (CLARITIN) 10 MG tablet Take 10 mg by mouth daily.  4  . metFORMIN (GLUCOPHAGE) 500 MG tablet Take 500 mg by mouth 2 (two) times daily.  3  . omeprazole (PRILOSEC) 20 MG capsule   3  . triamcinolone cream (KENALOG) 0.1 % APPLY CREAM  TO AFFECTED AREA TWO TIMES DAILY  3   No current facility-administered medications for this visit.    Review of Systems Review of Systems  Constitutional: Negative.   Respiratory: Negative.   Cardiovascular: Negative.     Blood pressure 156/76, pulse 66, resp. rate 12, height 5\' 4"  (1.626 m), weight 154 lb (69.854 kg).  Physical Exam Physical Exam Exam shows an unchanged skin lesion on right forearm measuring 2.5 to 3 cm diameter. Does not feel tethered to deeper tissue.  Data Reviewed   Assessment   SCC skin right forearm.      Plan   Procedure: excision skin malignancy, right forearm dorsal aspect with intermediate closure of defect.  Anesth: 15 ml of 1% Xylocaine mixed with 0.5% Marcaine  Prep: Chloroprep  Description: area was prepped and draped out. The skin lesion was excised out with a 2-3 mm margin. The lateral and superior aspects were tagged with nylon stitches for orientation and sent to pathology. The resulting defect measured approximately 3.5 cm in diameter. Bleeders were controlled with suture ligatures and cautery. Subcutaneous tissue was approximated with interrupted 3-0 Vicryl. Skin was closed with a single 4-0 Nylon  stitch in the middle. On either side the incision was closed with running suture 5-0 Prolene and incision was covered with neosporin ointment.  Dressing with Telfa gauze and Kerlix. Pt advised to leave the area clean and dry. Recheck in 4 days.     PCP:  Cletis [place] This information has been scribed by Karie Fetch [place].    Ahnika Hannibal G 09/12/2015, 4:37 PM

## 2015-09-12 NOTE — Patient Instructions (Signed)
Keep area clean 

## 2015-09-16 ENCOUNTER — Ambulatory Visit (INDEPENDENT_AMBULATORY_CARE_PROVIDER_SITE_OTHER): Payer: Medicare Other | Admitting: General Surgery

## 2015-09-16 ENCOUNTER — Encounter: Payer: Self-pay | Admitting: General Surgery

## 2015-09-16 VITALS — BP 128/74 | HR 82 | Resp 14 | Ht 64.0 in | Wt 156.0 lb

## 2015-09-16 DIAGNOSIS — C44622 Squamous cell carcinoma of skin of right upper limb, including shoulder: Secondary | ICD-10-CM

## 2015-09-16 DIAGNOSIS — C44621 Squamous cell carcinoma of skin of unspecified upper limb, including shoulder: Secondary | ICD-10-CM | POA: Insufficient documentation

## 2015-09-16 NOTE — Patient Instructions (Signed)
Return on Friday morning.

## 2015-09-16 NOTE — Progress Notes (Signed)
This is a 82 year old female here today for her post op right forearm SCC excision done on 09/12/15. Patient states she is doing well.  Path pending. area is clean and sutures are intact. Wound was redressed and pt advised to do the same.  follow up in 4 days for suture removal.    PCP:  Cletis [place] This information has been scribed by Gaspar Cola CMA.

## 2015-09-17 ENCOUNTER — Telehealth: Payer: Self-pay | Admitting: *Deleted

## 2015-09-17 DIAGNOSIS — I10 Essential (primary) hypertension: Secondary | ICD-10-CM | POA: Diagnosis not present

## 2015-09-17 DIAGNOSIS — N814 Uterovaginal prolapse, unspecified: Secondary | ICD-10-CM | POA: Diagnosis not present

## 2015-09-17 DIAGNOSIS — L658 Other specified nonscarring hair loss: Secondary | ICD-10-CM | POA: Diagnosis not present

## 2015-09-17 DIAGNOSIS — L821 Other seborrheic keratosis: Secondary | ICD-10-CM | POA: Diagnosis not present

## 2015-09-17 NOTE — Telephone Encounter (Signed)
-----   Message from Christene Lye, MD sent at 09/16/2015  4:16 PM EST ----- Rosann Auerbach please let pt pt know the pathology showed compete removal of the skin cancer

## 2015-09-17 NOTE — Telephone Encounter (Signed)
Notified patient as instructed, patient pleased. Discussed follow-up appointments, patient agrees  

## 2015-09-20 ENCOUNTER — Ambulatory Visit (INDEPENDENT_AMBULATORY_CARE_PROVIDER_SITE_OTHER): Payer: Medicare Other | Admitting: General Surgery

## 2015-09-20 ENCOUNTER — Encounter: Payer: Self-pay | Admitting: General Surgery

## 2015-09-20 VITALS — BP 130/78 | HR 14 | Resp 82 | Ht 64.0 in | Wt 158.0 lb

## 2015-09-20 DIAGNOSIS — C44622 Squamous cell carcinoma of skin of right upper limb, including shoulder: Secondary | ICD-10-CM

## 2015-09-20 NOTE — Progress Notes (Signed)
This is a 82 year old female here today for her follow up right forearm excision done on 09/12/15. I have reviewed the history of present illness with the patient. Incision looks clean, no signs of infection. The sutures were removed. Path- SCC, margins clear Patient to return in one month. CP:  Masoud, Viann Shove  This information has been scribed by Gaspar Cola CMA.

## 2015-09-20 NOTE — Patient Instructions (Addendum)
Patient to return in one month. 

## 2015-09-21 ENCOUNTER — Encounter: Payer: Self-pay | Admitting: General Surgery

## 2015-09-25 ENCOUNTER — Ambulatory Visit (INDEPENDENT_AMBULATORY_CARE_PROVIDER_SITE_OTHER): Payer: Medicare Other | Admitting: Obstetrics and Gynecology

## 2015-09-25 ENCOUNTER — Encounter: Payer: Self-pay | Admitting: Obstetrics and Gynecology

## 2015-09-25 VITALS — BP 153/83 | HR 83 | Wt 155.4 lb

## 2015-09-25 DIAGNOSIS — T8389XD Other specified complication of genitourinary prosthetic devices, implants and grafts, subsequent encounter: Secondary | ICD-10-CM

## 2015-09-25 DIAGNOSIS — N8110 Cystocele, unspecified: Secondary | ICD-10-CM | POA: Diagnosis not present

## 2015-09-25 DIAGNOSIS — N814 Uterovaginal prolapse, unspecified: Secondary | ICD-10-CM

## 2015-09-25 DIAGNOSIS — Z4689 Encounter for fitting and adjustment of other specified devices: Secondary | ICD-10-CM

## 2015-09-25 DIAGNOSIS — IMO0002 Reserved for concepts with insufficient information to code with codable children: Secondary | ICD-10-CM

## 2015-09-25 DIAGNOSIS — N816 Rectocele: Secondary | ICD-10-CM

## 2015-09-25 DIAGNOSIS — N898 Other specified noninflammatory disorders of vagina: Secondary | ICD-10-CM

## 2015-09-25 NOTE — Progress Notes (Signed)
Chief complaint: 1. Pessary maintenance. 2. Cystocele, rectocele, uterine prolapse.  Patient presents for 6 week pessary maintenance evaluation (patient presented 2 weeks early for evaluation). She is using Premarin cream intravaginal twice a week. History of a posterior vaginal erosion which was managed with pessary holiday for 10 days. Patient does not report any vaginal discharge, vaginal bleeding, or vaginal odor.  New diagnosis of skin cancer on right wrist, recently excised, pathology consistent with squamous cell carcinoma completely excised.  Past medical history, past surgical history, problem list, medications, and allergies are reviewed.  Review of systems: Per HPI.  OBJECTIVE: BP 153/83 mmHg  Pulse 83  Wt 155 lb 7 oz (70.506 kg) Frail, elderly female in no acute distress.  Pelvic exam: External genitalia-normal; mild perianal/: Inflammation (secondary to plastic undergarment BUS-normal. Vagina-third degree cystocele; moderate rectocele; posterior vaginal erosion With slight friability and no evidence of infection Cervix-Posterior cervical polyp Uterus-Not examined Not examined Rectovaginal-normal external exam.  ASSESSMENT: 1. Chronic posterior vaginal erosion, friable, stable. 2. Pelvic organ prolapse, very symptomatic without pessary in place. 3. Pessary maintenance-normal 4. Peritoneal inflammation due toplastic undergarment  PROCEDURE: Remove, clean, and reinsert pessary.   PLAN: 1. Remove clean and reinsert Gellhorn pessary. 2. Continue with Premarin cream intravaginally twice a week. 3. Return in 6 weeks for pessary maintenance. 4. Use Desitin ointment when necessary when using plastic undergarment  A total of 15 minutes were spent face-to-face with the patient during this encounter and over half of that time dealt with counseling and coordination of care.  Brayton Mars, MD  Note: This dictation was prepared with Dragon dictation  along with smaller phrase technology. Any transcriptional errors that result from this process are unintentional.

## 2015-10-08 DIAGNOSIS — T360X5S Adverse effect of penicillins, sequela: Secondary | ICD-10-CM | POA: Diagnosis not present

## 2015-10-08 DIAGNOSIS — I10 Essential (primary) hypertension: Secondary | ICD-10-CM | POA: Diagnosis not present

## 2015-10-08 DIAGNOSIS — L821 Other seborrheic keratosis: Secondary | ICD-10-CM | POA: Diagnosis not present

## 2015-10-08 DIAGNOSIS — I359 Nonrheumatic aortic valve disorder, unspecified: Secondary | ICD-10-CM | POA: Diagnosis not present

## 2015-10-23 ENCOUNTER — Encounter: Payer: Self-pay | Admitting: General Surgery

## 2015-10-23 ENCOUNTER — Ambulatory Visit (INDEPENDENT_AMBULATORY_CARE_PROVIDER_SITE_OTHER): Payer: Medicare Other | Admitting: General Surgery

## 2015-10-23 VITALS — BP 136/82 | HR 84 | Resp 12 | Ht 60.0 in | Wt 158.0 lb

## 2015-10-23 DIAGNOSIS — C44622 Squamous cell carcinoma of skin of right upper limb, including shoulder: Secondary | ICD-10-CM

## 2015-10-23 NOTE — Progress Notes (Signed)
Patient ID: Tonya Atkinson, female   DOB: Apr 16, 1928, 82 y.o.   MRN: [id number] Patient here today her follow up right forearm SCC excision done on 09/12/15. Patient states she is doing well, mild swelling at times.  I have reviewed the history of present illness with the patient.  Incision well healed, tiny scab area present on the incision, no apparent sign of residual cancer.  No lymphadenopathy in axilla or neck.  Patient aware to keep an eye on area.   This information has been scribed by Verlene Mayer, CMA   Return in 2 months.  PCP: Dr. Cletis Athens

## 2015-10-23 NOTE — Patient Instructions (Signed)
Call with any questions or concerns 

## 2015-11-06 ENCOUNTER — Ambulatory Visit (INDEPENDENT_AMBULATORY_CARE_PROVIDER_SITE_OTHER): Payer: Medicare Other | Admitting: Obstetrics and Gynecology

## 2015-11-06 ENCOUNTER — Encounter: Payer: Self-pay | Admitting: Obstetrics and Gynecology

## 2015-11-06 VITALS — BP 146/83 | HR 108 | Wt 158.1 lb

## 2015-11-06 DIAGNOSIS — N816 Rectocele: Secondary | ICD-10-CM

## 2015-11-06 DIAGNOSIS — N8110 Cystocele, unspecified: Secondary | ICD-10-CM

## 2015-11-06 DIAGNOSIS — N814 Uterovaginal prolapse, unspecified: Secondary | ICD-10-CM | POA: Diagnosis not present

## 2015-11-06 DIAGNOSIS — IMO0002 Reserved for concepts with insufficient information to code with codable children: Secondary | ICD-10-CM

## 2015-11-06 DIAGNOSIS — Z4689 Encounter for fitting and adjustment of other specified devices: Secondary | ICD-10-CM | POA: Diagnosis not present

## 2015-11-06 DIAGNOSIS — N898 Other specified noninflammatory disorders of vagina: Secondary | ICD-10-CM | POA: Diagnosis not present

## 2015-11-06 NOTE — Patient Instructions (Signed)
Return in 6 weeks for pessary maintenance Continue with Premarin cream intravaginal twice a week

## 2015-11-06 NOTE — Progress Notes (Signed)
Chief complaint: 1. Pessary maintenance 2. Cystocele, rectocele, uterine prolapse  Patient presents for 6 week pessary maintenance evaluation. She is using Premarin cream intravaginal twice a week. She does have history of a posterior vaginal erosion, chronic, which waxes and wanes. Currently she does not report any vaginal discharge, vaginal bleeding, or vaginal odor.  Past medical history, past surgical history, problem list, medications, and allergies are reviewed.  OBJECTIVE: BP 146/83 mmHg  Pulse 108  Wt 158 lb 1 oz (71.697 kg)   Frail, elderly female in no acute distress.  Pelvic exam: External genitalia-normal BUS-normal. Vagina-third degree cystocele; moderate rectocele; posterior vaginal erosion With slight friability and no evidence of infection Cervix-Posterior cervical polyp Uterus-Not examined Not examined Rectovaginal-normal external exam.  PROCEDURE: Remove, clean, and reinsert pessary  ASSESSMENT: 1. Chronic posterior vaginal erosion, friable, stable. 2. Pelvic organ prolapse, very symptomatic without pessary in place. 3. Pessary maintenance-normal   PLAN: 1. Remove clean and reinsert Gellhorn pessary. 2. Continue with Premarin cream intravaginally twice a week. 3. Return in 6 weeks for pessary maintenance.  A total of 15 minutes were spent face-to-face with the patient during this encounter and over half of that time dealt with counseling and coordination of care.  Brayton Mars, MD  Note: This dictation was prepared with Dragon dictation along with smaller phrase technology. Any transcriptional errors that result from this process are unintentional.

## 2015-12-18 ENCOUNTER — Encounter: Payer: Self-pay | Admitting: Obstetrics and Gynecology

## 2015-12-18 ENCOUNTER — Ambulatory Visit (INDEPENDENT_AMBULATORY_CARE_PROVIDER_SITE_OTHER): Payer: Medicare Other | Admitting: Obstetrics and Gynecology

## 2015-12-18 VITALS — BP 134/72 | HR 81 | Ht 60.0 in | Wt 157.3 lb

## 2015-12-18 DIAGNOSIS — Z4689 Encounter for fitting and adjustment of other specified devices: Secondary | ICD-10-CM | POA: Diagnosis not present

## 2015-12-18 DIAGNOSIS — IMO0002 Reserved for concepts with insufficient information to code with codable children: Secondary | ICD-10-CM

## 2015-12-18 DIAGNOSIS — N814 Uterovaginal prolapse, unspecified: Secondary | ICD-10-CM

## 2015-12-18 DIAGNOSIS — N816 Rectocele: Secondary | ICD-10-CM

## 2015-12-18 DIAGNOSIS — N8110 Cystocele, unspecified: Secondary | ICD-10-CM | POA: Diagnosis not present

## 2015-12-18 NOTE — Progress Notes (Signed)
Chief complaint: 1. Pessary maintenance 2. Cystocele, rectocele, uterine prolapse 3. History of vaginal erosion  Patient presents for 6 week pessary maintenance evaluation. She is using Premarin cream intravaginal twice a week. She does have history of posterior vaginal erosion, chronic, which waxes and wanes. Currently she does not report any vaginal discharge, vaginal bleeding, or vaginal odor.  Past medical history, past surgical history, problem list, medications, and allergies are reviewed  OBJECTIVE: BP 134/72 mmHg  Pulse 81  Ht 5' (1.524 m)  Wt 157 lb 4.8 oz (71.351 kg)  BMI 30.72 kg/m2 Frail, elderly female in no acute distress.  Pelvic exam: External genitalia-normal BUS-normal. Vagina-third degree cystocele; moderate rectocele; posterior vaginal erosion With slight friability and no evidence of infection (stable) Cervix-Posterior cervical polyp Uterus-Not examined Not examined Rectovaginal-normal external exam.  PROCEDURE: Remove, clean, and reinsert pessary  ASSESSMENT: 1. Chronic posterior vaginal erosion, friable, stable 2. Pelvic organ prolapse, very symptomatic without pessary in place 3. Pessary maintenance-normal  PLAN: 1. Remove clean and reinsert Gellhorn pessary. 2. Continue with Premarin cream intravaginally twice a week. 3. Return in 6 weeks for pessary maintenance.  A total of 15 minutes were spent face-to-face with the patient during this encounter and over half of that time dealt with counseling and coordination of care.  Brayton Mars, MD  Note: This dictation was prepared with Dragon dictation along with smaller phrase technology. Any transcriptional errors that result from this process are unintentional.

## 2015-12-23 ENCOUNTER — Encounter: Payer: Self-pay | Admitting: *Deleted

## 2015-12-24 ENCOUNTER — Encounter: Payer: Self-pay | Admitting: General Surgery

## 2015-12-24 ENCOUNTER — Ambulatory Visit (INDEPENDENT_AMBULATORY_CARE_PROVIDER_SITE_OTHER): Payer: Medicare Other | Admitting: General Surgery

## 2015-12-24 VITALS — BP 154/90 | HR 72 | Resp 14 | Ht 60.0 in | Wt 157.0 lb

## 2015-12-24 DIAGNOSIS — C44622 Squamous cell carcinoma of skin of right upper limb, including shoulder: Secondary | ICD-10-CM | POA: Diagnosis not present

## 2015-12-24 NOTE — Patient Instructions (Signed)
The patient is aware to call back for any questions or concerns.  

## 2015-12-24 NOTE — Progress Notes (Signed)
She is here today for follow up right forearm SCC excision done on 09/12/15. Patient states she is doing well, no swelling. I have reviewed the history of present illness with the patient.  VSS. Meds/allergies revioewed. Incision clean and well healed in right forearm. No axillary or neck nodes noted. Lungs clear, HRR. Abdomen soft, no mass or tenderness. Skin with multiple keratosis, nothing suspicious  Stable physical exam. No sign of recurrence of skin SCC, no new findings   Follow up as needed. The patient is aware to call back for any questions or concerns.   PCP:  Cletis Athens This information has been scribed by Karie Fetch RN, BSN,BC.

## 2016-01-07 DIAGNOSIS — S5011XS Contusion of right forearm, sequela: Secondary | ICD-10-CM | POA: Diagnosis not present

## 2016-01-07 DIAGNOSIS — E119 Type 2 diabetes mellitus without complications: Secondary | ICD-10-CM | POA: Diagnosis not present

## 2016-01-07 DIAGNOSIS — S51011A Laceration without foreign body of right elbow, initial encounter: Secondary | ICD-10-CM | POA: Diagnosis not present

## 2016-01-29 ENCOUNTER — Ambulatory Visit (INDEPENDENT_AMBULATORY_CARE_PROVIDER_SITE_OTHER): Payer: Medicare Other | Admitting: Obstetrics and Gynecology

## 2016-01-29 ENCOUNTER — Encounter: Payer: Self-pay | Admitting: Obstetrics and Gynecology

## 2016-01-29 VITALS — BP 152/79 | HR 76 | Ht 60.0 in | Wt 154.9 lb

## 2016-01-29 DIAGNOSIS — N8110 Cystocele, unspecified: Secondary | ICD-10-CM | POA: Diagnosis not present

## 2016-01-29 DIAGNOSIS — N816 Rectocele: Secondary | ICD-10-CM | POA: Diagnosis not present

## 2016-01-29 DIAGNOSIS — N898 Other specified noninflammatory disorders of vagina: Secondary | ICD-10-CM | POA: Diagnosis not present

## 2016-01-29 DIAGNOSIS — T8389XD Other specified complication of genitourinary prosthetic devices, implants and grafts, subsequent encounter: Secondary | ICD-10-CM | POA: Diagnosis not present

## 2016-01-29 DIAGNOSIS — Z4689 Encounter for fitting and adjustment of other specified devices: Secondary | ICD-10-CM

## 2016-01-29 DIAGNOSIS — IMO0002 Reserved for concepts with insufficient information to code with codable children: Secondary | ICD-10-CM

## 2016-01-29 NOTE — Progress Notes (Signed)
Chief complaint: 1. Pessary maintenance 2. Cystocele, rectocele, uterine prolapse 3. History of vaginal erosion  Patient presents for 6 week pessary maintenance evaluation. She is using Premarin cream intravaginal twice a week. She does have history of posterior vaginal erosion, chronic, which waxes and wanes. Currently she does not report any vaginal discharge, vaginal bleeding, or vaginal odor.  Past medical history, past surgical history, problem list, medications, and allergies are reviewed  OBJECTIVE: BP 152/79 mmHg  Pulse 76  Ht 5' (1.524 m)  Wt 154 lb 14.4 oz (70.262 kg)  BMI 30.25 kg/m2 Frail, elderly female in no acute distress.  Pelvic exam: External genitalia-normal BUS-normal. Vagina-third degree cystocele; moderate rectocele; posterior vaginal erosion With slight friability and no evidence of infection (stable) Cervix-Posterior cervical polyp Uterus-Not examined Not examined Rectovaginal-normal external exam.  PROCEDURE: Remove, clean, and reinsert pessary  ASSESSMENT: 1. Chronic posterior vaginal erosion, friable, stable 2. Pelvic organ prolapse, very symptomatic without pessary in place 3. Pessary maintenance-normal  PLAN: 1. Remove clean and reinsert Gellhorn pessary. 2. Continue with Premarin cream intravaginally twice a week. 3. Return in 6 weeks for pessary maintenance.  Brayton Mars, MD  Note: This dictation was prepared with Dragon dictation along with smaller phrase technology. Any transcriptional errors that result from this process are unintentional.

## 2016-01-29 NOTE — Patient Instructions (Signed)
Return in 6 weeks for pessary maintenance

## 2016-03-11 ENCOUNTER — Ambulatory Visit (INDEPENDENT_AMBULATORY_CARE_PROVIDER_SITE_OTHER): Payer: Medicare Other | Admitting: Obstetrics and Gynecology

## 2016-03-11 VITALS — BP 133/70 | HR 85 | Ht 60.0 in | Wt 153.0 lb

## 2016-03-11 DIAGNOSIS — N898 Other specified noninflammatory disorders of vagina: Secondary | ICD-10-CM | POA: Diagnosis not present

## 2016-03-11 DIAGNOSIS — Z4689 Encounter for fitting and adjustment of other specified devices: Secondary | ICD-10-CM | POA: Diagnosis not present

## 2016-03-11 DIAGNOSIS — N816 Rectocele: Secondary | ICD-10-CM | POA: Diagnosis not present

## 2016-03-11 DIAGNOSIS — N8110 Cystocele, unspecified: Secondary | ICD-10-CM

## 2016-03-11 DIAGNOSIS — IMO0002 Reserved for concepts with insufficient information to code with codable children: Secondary | ICD-10-CM

## 2016-03-11 NOTE — Patient Instructions (Signed)
Return in 6 weeks for pessary maintenance

## 2016-03-11 NOTE — Progress Notes (Signed)
Chief complaint: 1. Pessary maintenance 2. Cystocele, rectocele, uterine prolapse 3. History of vaginal erosion  Patient presents for 6 week pessary maintenance evaluation. She is using Premarin cream intravaginal twice a week. She does have history of posterior vaginal erosion, chronic, which waxes and wanes. Currently she does not report any vaginal discharge, vaginal bleeding, or vaginal odor.  Past medical history, past surgical history, problem list, medications, and allergies are reviewed  OBJECTIVE: BP 133/70   Pulse 85   Ht 5' (1.524 m)   Wt 153 lb (69.4 kg)   BMI 29.88 kg/m  Frail, elderly female in no acute distress.  Pelvic exam: External genitalia-normal BUS-normal. Vagina-third degree cystocele; moderate rectocele; posterior vaginal erosion With slight friability and no evidence of infection (stable) Cervix-Posterior cervical polyp Uterus-Not examined Not examined Rectovaginal-normal external exam.  PROCEDURE: Remove, clean, and reinsert pessary  ASSESSMENT: 1. Chronic posterior vaginal erosion, friable, stable 2. Pelvic organ prolapse, very symptomatic without pessary in place 3. Pessary maintenance-normal  PLAN: 1. Remove clean and reinsert Gellhorn pessary. 2. Continue with Premarin cream intravaginally twice a week. 3. Return in 6 weeks for pessary maintenance.  A total of 15 minutes were spent face-to-face with the patient during this encounter and over half of that time dealt with counseling and coordination of care.  Brayton Mars, MD  Note: This dictation was prepared with Dragon dictation along with smaller phrase technology. Any transcriptional errors that result from this process are unintentional.

## 2016-04-08 DIAGNOSIS — L658 Other specified nonscarring hair loss: Secondary | ICD-10-CM | POA: Diagnosis not present

## 2016-04-08 DIAGNOSIS — I10 Essential (primary) hypertension: Secondary | ICD-10-CM | POA: Diagnosis not present

## 2016-04-08 DIAGNOSIS — I359 Nonrheumatic aortic valve disorder, unspecified: Secondary | ICD-10-CM | POA: Diagnosis not present

## 2016-04-08 DIAGNOSIS — Z23 Encounter for immunization: Secondary | ICD-10-CM | POA: Diagnosis not present

## 2016-04-08 DIAGNOSIS — L821 Other seborrheic keratosis: Secondary | ICD-10-CM | POA: Diagnosis not present

## 2016-04-22 ENCOUNTER — Ambulatory Visit (INDEPENDENT_AMBULATORY_CARE_PROVIDER_SITE_OTHER): Payer: Medicare Other | Admitting: Obstetrics and Gynecology

## 2016-04-22 VITALS — BP 145/66 | HR 71 | Ht 60.0 in | Wt 150.5 lb

## 2016-04-22 DIAGNOSIS — N816 Rectocele: Secondary | ICD-10-CM | POA: Diagnosis not present

## 2016-04-22 DIAGNOSIS — Z4689 Encounter for fitting and adjustment of other specified devices: Secondary | ICD-10-CM | POA: Diagnosis not present

## 2016-04-22 DIAGNOSIS — N898 Other specified noninflammatory disorders of vagina: Secondary | ICD-10-CM

## 2016-04-22 DIAGNOSIS — N8111 Cystocele, midline: Secondary | ICD-10-CM | POA: Diagnosis not present

## 2016-04-22 DIAGNOSIS — N814 Uterovaginal prolapse, unspecified: Secondary | ICD-10-CM

## 2016-04-22 NOTE — Patient Instructions (Signed)
1. Continue using Premarin cream intravaginal twice a week 2. Return in 6 weeks for pessary maintenance

## 2016-04-22 NOTE — Progress Notes (Signed)
Chief complaint: 1. Pessary maintenance (last visit 03/11/2016) 2. Cystocele, rectocele, uterine prolapse 3. History of vaginal erosion  Patient presents for 6 week pessary maintenance evaluation. She is using Premarin cream intravaginal twice a week. She does have history of posterior vaginal erosion, chronic, which waxes and wanes. Currently she does not report any vaginal discharge, vaginal bleeding, or vaginal odor. Only complaint today is mild constipation  Past medical history, past surgical history, problem list, medications, and allergies are reviewed  OBJECTIVE: BP (!) 145/66   Pulse 71   Ht 5' (1.524 m)   Wt 150 lb 8 oz (68.3 kg)   BMI 29.39 kg/m  Frail, elderly female in no acute distress.  Pelvic exam: External genitalia-normal BUS-normal. Vagina-third degree cystocele; moderate rectocele; posterior vaginal erosion With slight friability and no evidence of infection (stable) Cervix-Posterior cervical polyp Uterus-Not examined Not examined Rectovaginal-normal external exam.  PROCEDURE: Remove, clean, and reinsert pessary  ASSESSMENT: 1. Chronic posterior vaginal erosion, friable, stable 2. Pelvic organ prolapse, very symptomatic without pessary in place 3. Pessary maintenance-normal  PLAN: 1. Remove clean and reinsert Gellhorn pessary. 2. Continue with Premarin cream intravaginally twice a week. 3. Return in 6 weeks for pessary maintenance.  A total of 15 minutes were spent face-to-face with the patient during this encounter and over half of that time dealt with counseling and coordination of care.  Brayton Mars, MD  Note: This dictation was prepared with Dragon dictation along with smaller phrase technology. Any transcriptional errors that result from this process

## 2016-04-27 DIAGNOSIS — E119 Type 2 diabetes mellitus without complications: Secondary | ICD-10-CM | POA: Diagnosis not present

## 2016-04-27 DIAGNOSIS — I10 Essential (primary) hypertension: Secondary | ICD-10-CM | POA: Diagnosis not present

## 2016-04-27 DIAGNOSIS — I359 Nonrheumatic aortic valve disorder, unspecified: Secondary | ICD-10-CM | POA: Diagnosis not present

## 2016-04-27 DIAGNOSIS — L209 Atopic dermatitis, unspecified: Secondary | ICD-10-CM | POA: Diagnosis not present

## 2016-05-06 DIAGNOSIS — I10 Essential (primary) hypertension: Secondary | ICD-10-CM | POA: Diagnosis not present

## 2016-05-06 DIAGNOSIS — L308 Other specified dermatitis: Secondary | ICD-10-CM | POA: Diagnosis not present

## 2016-05-06 DIAGNOSIS — L821 Other seborrheic keratosis: Secondary | ICD-10-CM | POA: Diagnosis not present

## 2016-05-06 DIAGNOSIS — I359 Nonrheumatic aortic valve disorder, unspecified: Secondary | ICD-10-CM | POA: Diagnosis not present

## 2016-06-03 ENCOUNTER — Ambulatory Visit (INDEPENDENT_AMBULATORY_CARE_PROVIDER_SITE_OTHER): Payer: Medicare Other | Admitting: Obstetrics and Gynecology

## 2016-06-03 ENCOUNTER — Encounter: Payer: Self-pay | Admitting: Obstetrics and Gynecology

## 2016-06-03 VITALS — BP 147/83 | HR 98 | Ht 60.0 in | Wt 149.2 lb

## 2016-06-03 DIAGNOSIS — T8389XD Other specified complication of genitourinary prosthetic devices, implants and grafts, subsequent encounter: Secondary | ICD-10-CM | POA: Diagnosis not present

## 2016-06-03 DIAGNOSIS — N898 Other specified noninflammatory disorders of vagina: Secondary | ICD-10-CM | POA: Diagnosis not present

## 2016-06-03 DIAGNOSIS — N8111 Cystocele, midline: Secondary | ICD-10-CM | POA: Diagnosis not present

## 2016-06-03 DIAGNOSIS — N816 Rectocele: Secondary | ICD-10-CM

## 2016-06-03 DIAGNOSIS — Z4689 Encounter for fitting and adjustment of other specified devices: Secondary | ICD-10-CM

## 2016-06-03 DIAGNOSIS — N814 Uterovaginal prolapse, unspecified: Secondary | ICD-10-CM | POA: Diagnosis not present

## 2016-06-03 NOTE — Patient Instructions (Signed)
1. Return in 6 weeks for pessary maintenance or sooner if pelvic pain, vagnial bleeding, or vaginal discharge develop

## 2016-06-03 NOTE — Progress Notes (Signed)
Chiefcomplaint: 1 Pessary maintenance (last visit 04/22/2016) 2. Cystocele, rectocele, uterine prolpse 3. History of vaginal erosion  Patient presents for 6 week pessary maintenance evaluation. She is using Premarin cream intravaginal twice a week. She does have history of posterior vaginal erosion, chronic, which waxes and wanes. Currently she does not report any vaginal discharge, vaginal bleeding, or vaginal odor. Only complaint today is mild constipation  Past medical history, past surgical history, problem list, medications, and allergies are reviewed  OBJECTIVE: BP (!) 147/83   Pulse 98   Ht 5' (1.524 m)   Wt 149 lb 3.2 oz (67.7 kg)   BMI 29.14 kg/m  Frail, elderly female in no acute distress.  Pelvic exam: External genitalia-normal BUS-normal. Vagina-third degree cystocele; moderate rectocele; posterior vaginal erosion With slight friability posterior vagina -right side) and no evidence of infection (stable) Cervix-Posterior cervical polyp Uterus-Not examined Not examined Rectovaginal-normal external exam.  PROCEDURE: Remove, clean, and reinsert pessary  ASSESSMENT: 1. Chronic posterior vaginal erosion, friable, stable 2. Pelvic organ prolapse, very symptomatic without pessary in place 3. Pessary maintenance-normal  PLAN: 1. Remove clean and reinsert Gellhorn pessary. 2. Continue with Premarin cream intravaginally twice a week. 3. Return in 6 weeks for pessary maintenance.  A total of 15 minutes were spent face-to-face with the patient during this encounter and over half of that time dealt with counseling and coordination of care.  Brayton Mars, MD  Note: This dictation was prepared with Dragon dictation along with smaller phrase technology. Any transcriptional errors that result from this process are unintentional.

## 2016-07-02 DIAGNOSIS — I10 Essential (primary) hypertension: Secondary | ICD-10-CM | POA: Diagnosis not present

## 2016-07-02 DIAGNOSIS — I359 Nonrheumatic aortic valve disorder, unspecified: Secondary | ICD-10-CM | POA: Diagnosis not present

## 2016-07-02 DIAGNOSIS — E119 Type 2 diabetes mellitus without complications: Secondary | ICD-10-CM | POA: Diagnosis not present

## 2016-07-21 ENCOUNTER — Ambulatory Visit (INDEPENDENT_AMBULATORY_CARE_PROVIDER_SITE_OTHER): Payer: Medicare Other | Admitting: Obstetrics and Gynecology

## 2016-07-21 VITALS — BP 130/77 | HR 79 | Ht 60.0 in | Wt 144.3 lb

## 2016-07-21 DIAGNOSIS — Z4689 Encounter for fitting and adjustment of other specified devices: Secondary | ICD-10-CM

## 2016-07-21 DIAGNOSIS — N8111 Cystocele, midline: Secondary | ICD-10-CM

## 2016-07-21 DIAGNOSIS — N816 Rectocele: Secondary | ICD-10-CM

## 2016-07-21 DIAGNOSIS — N814 Uterovaginal prolapse, unspecified: Secondary | ICD-10-CM

## 2016-07-21 NOTE — Progress Notes (Signed)
Chiefcomplaint: 1 Pessary maintenance (last visit 06/03/2016) 2. Cystocele, rectocele, uterine prolpse 3. History of vaginal erosion  Patient presents for 6 week pessary maintenance evaluation. She is using Premarin cream intravaginal twice a week. She does have history of posterior vaginal erosion, chronic, which waxes and wanes. Currently she does not report any vaginal discharge, vaginal bleeding, or vaginal odor. Only complaint today is mild constipation  Past medical history, past surgical history, problem list, medications, and allergies are reviewed  OBJECTIVE: BP 130/77   Pulse 79   Ht 5' (1.524 m)   Wt 144 lb 4.8 oz (65.5 kg)   BMI 28.18 kg/m  Frail, elderly female in no acute distress.  Pelvic exam: External genitalia-normal BUS-normal. Vagina-third degree cystocele; moderate rectocele; posterior vaginal erosion With slight friability posterior vagina -right side) and no evidence of infection (stable) Cervix-Posterior cervical polyp Uterus-Not examined Not examined Rectovaginal-normal external exam.  PROCEDURE: Remove, clean, and reinsert pessary  ASSESSMENT: 1. Chronic posterior vaginal erosion, friable, stable 2. Pelvic organ prolapse, very symptomatic without pessary in place 3. Pessary maintenance-normal  PLAN: 1. Remove clean and reinsert Gellhorn pessary. 2. Continue with Premarin cream intravaginally twice a week. 3. Return in 6 weeks for pessary maintenance.  A total of 15 minutes were spent face-to-face with the patient during this encounter and over half of that time dealt with counseling and coordination of care.  Brayton Mars, MD

## 2016-07-21 NOTE — Patient Instructions (Signed)
1. Continue using Premarin cream twice a week intravaginal 2. Return in 6 weeks for pessary maintenance

## 2016-09-03 DIAGNOSIS — N814 Uterovaginal prolapse, unspecified: Secondary | ICD-10-CM | POA: Diagnosis not present

## 2016-09-03 DIAGNOSIS — E119 Type 2 diabetes mellitus without complications: Secondary | ICD-10-CM | POA: Diagnosis not present

## 2016-09-03 DIAGNOSIS — L659 Nonscarring hair loss, unspecified: Secondary | ICD-10-CM | POA: Diagnosis not present

## 2016-09-03 DIAGNOSIS — T360X5S Adverse effect of penicillins, sequela: Secondary | ICD-10-CM | POA: Diagnosis not present

## 2016-09-15 ENCOUNTER — Ambulatory Visit (INDEPENDENT_AMBULATORY_CARE_PROVIDER_SITE_OTHER): Payer: Medicare Other | Admitting: Obstetrics and Gynecology

## 2016-09-15 ENCOUNTER — Encounter: Payer: Self-pay | Admitting: Obstetrics and Gynecology

## 2016-09-15 VITALS — BP 148/78 | HR 85 | Ht 60.0 in | Wt 142.4 lb

## 2016-09-15 DIAGNOSIS — N898 Other specified noninflammatory disorders of vagina: Secondary | ICD-10-CM | POA: Diagnosis not present

## 2016-09-15 DIAGNOSIS — N816 Rectocele: Secondary | ICD-10-CM

## 2016-09-15 DIAGNOSIS — N8111 Cystocele, midline: Secondary | ICD-10-CM

## 2016-09-15 DIAGNOSIS — Z4689 Encounter for fitting and adjustment of other specified devices: Secondary | ICD-10-CM

## 2016-09-15 DIAGNOSIS — T8389XD Other specified complication of genitourinary prosthetic devices, implants and grafts, subsequent encounter: Secondary | ICD-10-CM | POA: Diagnosis not present

## 2016-09-15 NOTE — Patient Instructions (Signed)
1. Pessary is removed today. Recommend leaving the pessary out for 3 weeks to allow vaginal erosion to heal 2. Continue using estrogen cream intravaginal twice a week 3. Return in 3 weeks for follow-up

## 2016-09-15 NOTE — Progress Notes (Signed)
Chief complaint: 1. Pessary maintenance (last visit 07/21/2016) 2. Cystocele, rectocele, uterine prolapse 3. History of vaginal erosion  Patient presents for 6 weeks pessary maintenance evaluation. Pessary maintenance: Gel horn pessary Premarin cream intravaginal twice a week Trimosan gel-none Patient denies vaginal discharge, vaginal bleeding, or vaginal odor  Past medical history, past surgical history, problem list, medications, and allergies are reviewed  OBJECTIVE: BP (!) 148/78   Pulse 85   Ht 5' (1.524 m)   Wt 142 lb 6.4 oz (64.6 kg)   BMI 27.81 kg/m  Frail elderly female in no acute distress Abdomen: Soft, nontender Pelvic exam: External genitalia-normal BUS-normal Vagina-third degree cystocele; moderate rectocele; posterior vaginal erosion, friable, 3 x 4 cm midline and left-sided With slight friability posterior vagina -right side)and no evidence of infection (stable) Cervix-Posterior cervical polyp Uterus-Not examined Not examined Rectovaginal-normal external exam.  PROCEDURE: Gel horn pessary is removed, cleaned and not reinserted  ASSESSMENT: 1. Chronic posterior vaginal erosion, increased friability, slightly worse 2. Pelvic organ prolapse, symptomatic without pessary in place 3. Pessary maintenance exam-need to leave pessary out for vaginal erosion healing  PLAN: 1. Leave the gel horn pessary out 2. Continue with Premarin cream intravaginal twice a week 3. Return in 3 weeks for follow-up and possible reinsertion of pessary  A total of 15 minutes were spent face-to-face with the patient during this encounter and over half of that time dealt with counseling and coordination of care.  Brayton Mars, MD  Note: This dictation was prepared with Dragon dictation along with smaller phrase technology. Any transcriptional errors that result from this process are unintentional.

## 2016-10-06 ENCOUNTER — Encounter: Payer: Self-pay | Admitting: Obstetrics and Gynecology

## 2016-10-06 ENCOUNTER — Ambulatory Visit (INDEPENDENT_AMBULATORY_CARE_PROVIDER_SITE_OTHER): Payer: Medicare Other | Admitting: Obstetrics and Gynecology

## 2016-10-06 VITALS — Ht 60.0 in

## 2016-10-06 DIAGNOSIS — N816 Rectocele: Secondary | ICD-10-CM | POA: Diagnosis not present

## 2016-10-06 DIAGNOSIS — N814 Uterovaginal prolapse, unspecified: Secondary | ICD-10-CM | POA: Diagnosis not present

## 2016-10-06 DIAGNOSIS — T8389XD Other specified complication of genitourinary prosthetic devices, implants and grafts, subsequent encounter: Secondary | ICD-10-CM | POA: Diagnosis not present

## 2016-10-06 DIAGNOSIS — N898 Other specified noninflammatory disorders of vagina: Secondary | ICD-10-CM

## 2016-10-06 DIAGNOSIS — Z4689 Encounter for fitting and adjustment of other specified devices: Secondary | ICD-10-CM

## 2016-10-06 DIAGNOSIS — N8111 Cystocele, midline: Secondary | ICD-10-CM

## 2016-10-06 NOTE — Patient Instructions (Signed)
1. Pessary is inserted today 2.continue using Premarin cream intravaginal twice a week 3. Return in 6 weeks for pessary maintenance

## 2016-10-06 NOTE — Progress Notes (Signed)
GYN ENCOUNTER NOTE  Subjective:       Tonya Atkinson is a 83 y.o. female here today for a 3 week pessary maintenance follow up:   Chief Concerns:  1. h/o vaginal erosion  2. Gel horn Pessary Insertion   3. Cystocele, rectocele and uterine prolapse   Patient is using Premarin cream intravaginal twice a week. Patient denies vaginal bleeding, vaginal discharge, vaginal odor, pelvic pain. Patient does report difficulty functioning when total prolapse is present, which is often as the day continues.  No complete obstetric history on file, patient states she has 6 kids.   Gynecologic History No LMP recorded. Patient is postmenopausal.  No Pap smear or mammography results on file.  Contraception: NA - patient is postmenopausal   Obstetric History OB History  No data available    Past Medical History:  Diagnosis Date  . Cervical polyp   . Cystocele   . Cystocele   . Diabetes mellitus without complication ([place])   . Hypertension   . Procidentia of uterus   . Rectocele   . Uterine prolapse   . Vaginal abrasion   . Vaginal atrophy   . Vaginal erosion    secondary to pessary use    Past Surgical History:  Procedure Laterality Date  . TUBAL LIGATION      Current Outpatient Prescriptions on File Prior to Visit  Medication Sig Dispense Refill  . conjugated estrogens (PREMARIN) vaginal cream Place 0.625 g vaginally daily.    . [facility] CHEST CONGESTION RELIEF 400 MG TABS tablet Take 400 mg by mouth 2 (two) times daily.  6  . [facility] VITAMIN D3 1000 UNITS capsule Take 1,000 Units by mouth daily.  5  . enalapril (VASOTEC) 2.5 MG tablet Take 2.5 mg by mouth daily.  3  . hydrochlorothiazide (HYDRODIURIL) 25 MG tablet Take 25 mg by mouth daily.  3  . KLOR-CON M20 20 MEQ tablet Take 20 mEq by mouth 2 (two) times daily.  3  . loratadine (CLARITIN) 10 MG tablet Take 10 mg by mouth daily.  4  . metFORMIN (GLUCOPHAGE) 500 MG tablet Take 500 mg by mouth 2 (two) times daily.  3  . omeprazole  (PRILOSEC) 20 MG capsule   3  . triamcinolone cream (KENALOG) 0.1 % APPLY CREAM TO AFFECTED AREA TWO TIMES DAILY  3   No current facility-administered medications on file prior to visit.     Allergies  Allergen Reactions  . Penicillins     Social History   Social History  . Marital status: Widowed    Spouse name: N/A  . Number of children: N/A  . Years of education: N/A   Occupational History  . Not on file.   Social History Main Topics  . Smoking status: Never Smoker  . Smokeless tobacco: Never Used  . Alcohol use No  . Drug use: No  . Sexual activity: Not Currently   Other Topics Concern  . Not on file   Social History Narrative  . No narrative on file    Family History  Problem Relation Age of Onset  . Cancer Neg Hx     The following portions of the patient's history were reviewed and updated as appropriate: allergies, current medications, past family history, past medical history, past social history, past surgical history and problem list.  Review of Systems General: negative for - chills, fever, hot flashes,  Gastrointestinal: negative for - abdominal pain, and nausea/vomiting. (+) occasional constipation  Genito-Urinary: (+) complete cystocele,  limited patient's ability to leave house for past 3 weeks. (-) dysuria, vaginal spotting, vaginal pain, vaginal bleeding.   Objective:   Ht 5' (1.524 m)  CONSTITUTIONAL: Well-developed, well-nourished female in no acute distress.  CARDIOVASCULAR: RRR, diminished heart sounds RESPIRATORY: CTAB BREASTS: Not Examined PELVIC:  External Genitalia: Normal  BUS: Normal  Vagina: Right vaginal wall lesion, 2 x 3 cm, minimally friable; grade 3 cystocele; large rectocele  Cervix: Normal; no lesions; parous; no cervical motion tenderness  Uterus: Normal size, shape,consistency, mobile. Non tender, .non enlarged; prolapsed to introitus  Adnexa: Normal; nonpalpable and nontender  RV: Normal external exam  Bladder:  Nontender MUSCULOSKELETAL: Normal range of motion. No tenderness.  No cyanosis, clubbing, or edema. (+) herberden's nodes on right hand.    Skin: (+) mild erythema on right hand medial aspect, (patient uses Kenalog BID)  Assessment:   1. 3 week pessary maintenance check  2. Right vaginal erosion 90% healed, still 2 x 3 cm   3. Cystocele, rectocele, uterine prolapse    Plan:   1. Reinserted gel-horn pessary in office for POP  2. Use Premarin 0.625 g vaginally 2 times per week  3. Return in 6 weeks for pessary maintenance check    A total of 15 minutes were spent face-to-face with the patient during this encounter and over half of that time dealt with counseling and coordination of care.   Rose [place], PA-S  [employer]  Brayton Mars, MD   I have seen, interviewed, and examined the patient in conjunction with the [employer].A. student and affirm the diagnosis and management plan. Cabot Cromartie A. Garek Schuneman, MD, FACOG   Note: This dictation was prepared with Dragon dictation along with smaller phrase technology. Any transcriptional errors that result from this process are unintentional.

## 2016-11-02 DIAGNOSIS — E113393 Type 2 diabetes mellitus with moderate nonproliferative diabetic retinopathy without macular edema, bilateral: Secondary | ICD-10-CM | POA: Diagnosis not present

## 2016-11-17 ENCOUNTER — Encounter: Payer: Self-pay | Admitting: Obstetrics and Gynecology

## 2016-11-17 ENCOUNTER — Ambulatory Visit (INDEPENDENT_AMBULATORY_CARE_PROVIDER_SITE_OTHER): Payer: Medicare Other | Admitting: Obstetrics and Gynecology

## 2016-11-17 VITALS — BP 149/78 | HR 99 | Ht 60.0 in | Wt 141.1 lb

## 2016-11-17 DIAGNOSIS — N816 Rectocele: Secondary | ICD-10-CM | POA: Diagnosis not present

## 2016-11-17 DIAGNOSIS — N814 Uterovaginal prolapse, unspecified: Secondary | ICD-10-CM | POA: Diagnosis not present

## 2016-11-17 DIAGNOSIS — N8111 Cystocele, midline: Secondary | ICD-10-CM

## 2016-11-17 DIAGNOSIS — Z4689 Encounter for fitting and adjustment of other specified devices: Secondary | ICD-10-CM

## 2016-11-17 NOTE — Patient Instructions (Addendum)
1. Return in 6 weeks for pessary maintenance 2. Continue with Premarin cream intravaginal once or twice a week

## 2016-11-17 NOTE — Progress Notes (Signed)
Tonya Atkinson is a 83 y.o. female here today for a 6 week pessary maintenance follow up:   Chief Concerns:  1. h/o vaginal erosion  2. Gel horn Pessary Insertion   3. Cystocele, rectocele and uterine prolapse   Patient is using Premarin cream intravaginal twice a week. Patient denies vaginal bleeding, vaginal discharge, vaginal odor, pelvic pain. Patient reports constipation with irregular BMs.   No complete obstetric history on file, patient states she has 6 kids.   Gynecologic History No LMP recorded. Patient is postmenopausal.  No Pap smear or mammography results on file.  Contraception: NA - patient is postmenopausal   Obstetric History OB History  No data available        Past Medical History:  Diagnosis Date  . Cervical polyp   . Cystocele   . Cystocele   . Diabetes mellitus without complication ([place])   . Hypertension   . Procidentia of uterus   . Rectocele   . Uterine prolapse   . Vaginal abrasion   . Vaginal atrophy   . Vaginal erosion    secondary to pessary use         Past Surgical History:  Procedure Laterality Date  . TUBAL LIGATION            Current Outpatient Prescriptions on File Prior to Visit  Medication Sig Dispense Refill  . conjugated estrogens (PREMARIN) vaginal cream Place 0.625 g vaginally daily.    . CVS CHEST CONGESTION RELIEF 400 MG TABS tablet Take 400 mg by mouth 2 (two) times daily.  6  . CVS VITAMIN D3 1000 UNITS capsule Take 1,000 Units by mouth daily.  5  . enalapril (VASOTEC) 2.5 MG tablet Take 2.5 mg by mouth daily.  3  . hydrochlorothiazide (HYDRODIURIL) 25 MG tablet Take 25 mg by mouth daily.  3  . KLOR-CON M20 20 MEQ tablet Take 20 mEq by mouth 2 (two) times daily.  3  . loratadine (CLARITIN) 10 MG tablet Take 10 mg by mouth daily.  4  . metFORMIN (GLUCOPHAGE) 500 MG tablet Take 500 mg by mouth 2 (two) times daily.  3  . omeprazole (PRILOSEC) 20 MG capsule   3  . triamcinolone cream  (KENALOG) 0.1 % APPLY CREAM TO AFFECTED AREA TWO TIMES DAILY  3   No current facility-administered medications on file prior to visit.         Allergies  Allergen Reactions  . Penicillins     Social History        Social History  . Marital status: Widowed    Spouse name: N/A  . Number of children: N/A  . Years of education: N/A      Occupational History  . Not on file.       Social History Main Topics  . Smoking status: Never Smoker  . Smokeless tobacco: Never Used  . Alcohol use No  . Drug use: No  . Sexual activity: Not Currently       Other Topics Concern  . Not on file      Social History Narrative  . No narrative on file         Family History  Problem Relation Age of Onset  . Cancer Neg Hx     The following portions of the patient's history were reviewed and updated as appropriate: allergies, current medications, past family history, past medical history, past social history, past surgical history and problem list.  Review of Systems General: negative for -  chills, fever, hot flashes,  Gastrointestinal: negative for - abdominal pain, and nausea/vomiting. (+) occasional constipation  Genito-Urinary: (+) complete cystocele, limited patient's ability to leave house for past weeks. (-) dysuria, vaginal spotting, vaginal pain, vaginal bleeding.   Objective:   Ht 5' (1.524 m) BP (!) 149/78   Pulse 99   Ht 5' (1.524 m)   Wt 141 lb 1.6 oz (64 kg)   BMI 27.56 kg/m   CONSTITUTIONAL: Well-developed, well-nourished female in no acute distress.  CARDIOVASCULAR: RRR, diminished heart sounds RESPIRATORY: CTAB BREASTS: Not Examined PELVIC:             External Genitalia: Normal             BUS: Normal             Vagina: Right vaginal wall lesion, 2 x 3 cm, minimally friable; grade 3 cystocele; large rectocele             Cervix: Normal; no lesions; parous; no cervical motion tenderness; Small cervical polyp at 7:00              Uterus: Normal size, shape,consistency, mobile. Non tender, .non enlarged; prolapsed to introitus             Adnexa: Normal; nonpalpable and nontender             RV: Normal external exam             Bladder: Nontender MUSCULOSKELETAL: Normal range of motion. No tenderness.  No cyanosis, clubbing, or edema. (+) herberden's nodes on right hand.      Assessment:   1. 6 week pessary maintenance check, Stable 2. Right vaginal erosion 90% healed, still 2 x 3 cm, stable  3. Cystocele, rectocele, uterine prolapse    Plan:   1. Pessary is removed, cleaned, and reinserted 2. Continue to use Premarin 0.625 g vaginally 2 times per week     A total of 15 minutes were spent face-to-face with the patient during this encounter and over half of that time dealt with counseling and coordination of care.  Brayton Mars, MD  Note: This dictation was prepared with Dragon dictation along with smaller phrase technology. Any transcriptional errors that result from this process are unintentional.

## 2016-12-01 DIAGNOSIS — I359 Nonrheumatic aortic valve disorder, unspecified: Secondary | ICD-10-CM | POA: Diagnosis not present

## 2016-12-01 DIAGNOSIS — L659 Nonscarring hair loss, unspecified: Secondary | ICD-10-CM | POA: Diagnosis not present

## 2016-12-01 DIAGNOSIS — L308 Other specified dermatitis: Secondary | ICD-10-CM | POA: Diagnosis not present

## 2016-12-01 DIAGNOSIS — E119 Type 2 diabetes mellitus without complications: Secondary | ICD-10-CM | POA: Diagnosis not present

## 2016-12-30 ENCOUNTER — Encounter: Payer: Medicare Other | Admitting: Obstetrics and Gynecology

## 2017-01-05 ENCOUNTER — Encounter: Payer: Self-pay | Admitting: Obstetrics and Gynecology

## 2017-01-05 ENCOUNTER — Ambulatory Visit (INDEPENDENT_AMBULATORY_CARE_PROVIDER_SITE_OTHER): Payer: Medicare Other | Admitting: Obstetrics and Gynecology

## 2017-01-05 VITALS — BP 141/65 | HR 108 | Ht 60.0 in | Wt 141.9 lb

## 2017-01-05 DIAGNOSIS — N814 Uterovaginal prolapse, unspecified: Secondary | ICD-10-CM | POA: Diagnosis not present

## 2017-01-05 DIAGNOSIS — N898 Other specified noninflammatory disorders of vagina: Secondary | ICD-10-CM | POA: Diagnosis not present

## 2017-01-05 DIAGNOSIS — N816 Rectocele: Secondary | ICD-10-CM | POA: Diagnosis not present

## 2017-01-05 DIAGNOSIS — N8111 Cystocele, midline: Secondary | ICD-10-CM | POA: Diagnosis not present

## 2017-01-05 DIAGNOSIS — Z4689 Encounter for fitting and adjustment of other specified devices: Secondary | ICD-10-CM | POA: Diagnosis not present

## 2017-01-05 NOTE — Patient Instructions (Signed)
1. Continue using Premarin cream intravaginal twice a week 2. Return in 8 weeks for pessary maintenance

## 2017-01-05 NOTE — Progress Notes (Signed)
Tonya Atkinson Carteris an 83 y.o. femalehere todayfor a 6 week pessary maintenance follow up:   Chief Concerns:  1. h/o vaginal erosion  2. Gel horn Pessary Maintenance  3. Cystocele, rectocele and uterine prolapse   Patient is using Premarin cream intravaginal twice a week. Patient denies vaginal bleeding, vaginal discharge, vaginal odor, pelvic pain.  No complete obstetric history on file, patient states she has 6 kids.  Past medical history, past surgical history, problem list, medications, and allergies are reviewed  OBJECTIVE: BP (!) 141/65   Pulse (!) 108   Ht 5' (1.524 m)   Wt 141 lb 14.4 oz (64.4 kg)   BMI 27.71 kg/m   CONSTITUTIONAL: Well-developed, well-nourished female in no acute distress.  CARDIOVASCULAR: Not examined RESPIRATORY: Not examined BREASTS: Not Examined PELVIC: External Genitalia: Normal BUS: Normal Vagina: Right vaginal wall lesion, 2 x 3 cm, minimally friable; grade 3 cystocele; large rectocele Cervix: Normal; no lesions; parous; no cervical motion tenderness; Small cervical polyp at 7:00 Uterus: Normal size, shape,consistency, mobile. Non tender, .non enlarged Adnexa: Normal; nonpalpable and nontender RV: Normal external exam Bladder: Nontender  PROCEDURE: Pessary is removed, cleaned, reinserted  Assessment:   1. 6 week pessary maintenance check, Stable 2. Right vaginal erosion 90% healed, still 2 x 3 cm, stable  3. Cystocele, rectocele, uterine prolapse    Plan:   1. Pessary is removed, cleaned, and reinserted 2. Continue to use Premarin 0.625 g vaginally 2 times per week  3. Return in 8 weeks for pessary maintenance  A total of 15 minutes were spent face-to-face with the patient during this encounter and over half of that time dealt with counseling and coordination of care.  Brayton Mars, MD

## 2017-03-02 DIAGNOSIS — E113393 Type 2 diabetes mellitus with moderate nonproliferative diabetic retinopathy without macular edema, bilateral: Secondary | ICD-10-CM | POA: Diagnosis not present

## 2017-03-08 DIAGNOSIS — I10 Essential (primary) hypertension: Secondary | ICD-10-CM | POA: Diagnosis not present

## 2017-03-08 DIAGNOSIS — T360X5S Adverse effect of penicillins, sequela: Secondary | ICD-10-CM | POA: Diagnosis not present

## 2017-03-08 DIAGNOSIS — E119 Type 2 diabetes mellitus without complications: Secondary | ICD-10-CM | POA: Diagnosis not present

## 2017-03-08 DIAGNOSIS — I359 Nonrheumatic aortic valve disorder, unspecified: Secondary | ICD-10-CM | POA: Diagnosis not present

## 2017-03-09 ENCOUNTER — Encounter: Payer: Self-pay | Admitting: Obstetrics and Gynecology

## 2017-03-09 ENCOUNTER — Ambulatory Visit (INDEPENDENT_AMBULATORY_CARE_PROVIDER_SITE_OTHER): Payer: Medicare Other | Admitting: Obstetrics and Gynecology

## 2017-03-09 VITALS — BP 155/76 | HR 96 | Ht 60.0 in | Wt 139.7 lb

## 2017-03-09 DIAGNOSIS — N816 Rectocele: Secondary | ICD-10-CM | POA: Diagnosis not present

## 2017-03-09 DIAGNOSIS — N898 Other specified noninflammatory disorders of vagina: Secondary | ICD-10-CM | POA: Diagnosis not present

## 2017-03-09 DIAGNOSIS — Z4689 Encounter for fitting and adjustment of other specified devices: Secondary | ICD-10-CM | POA: Diagnosis not present

## 2017-03-09 DIAGNOSIS — N8111 Cystocele, midline: Secondary | ICD-10-CM | POA: Diagnosis not present

## 2017-03-09 NOTE — Progress Notes (Signed)
Tonya Atkinson Carteris an 83 y.o. femalehere todayfor an 8week pessary maintenance follow up:   Chief Concerns:  1. h/o vaginal erosion  2. Gel horn Pessary Maintenance  3. Cystocele, rectocele and uterine prolapse   Patient is using Premarin cream intravaginal twice a week. Patient denies vaginal bleeding, vaginal discharge, vaginal odor, pelvic pain.  No complete obstetric history on file, patient states she has 6 kids.  Past medical history, past surgical history, problem list, medications, and allergies are reviewed  OBJECTIVE: BP (!) 155/76   Pulse 96   Ht 5' (1.524 m)   Wt 139 lb 11.2 oz (63.4 kg)   BMI 27.28 kg/m  CONSTITUTIONAL: Well-developed, well-nourished female in no acute distress.  CARDIOVASCULAR: Not examined RESPIRATORY: Not examined BREASTS: Not Examined PELVIC: External Genitalia: Normal BUS: Normal Vagina: Right vaginal wall lesion, 2 x 3 cm, minimally friable; grade 3 cystocele; large rectocele Cervix: Normal; no lesions; parous; no cervical motion tenderness; Small cervical polyp at 7:00 Uterus: Normal size, shape,consistency, mobile. Non tender, .non enlarged Adnexa: Normal; nonpalpable and nontender RV: Normal external exam Bladder: Nontender  PROCEDURE: Pessary is removed, cleaned, reinserted  Assessment:   1. 8week pessary maintenance check, Stable 2. Left vaginal erosion , With minimal bleeding(, likely secondary to tissue friability and pessary removal) 3. Cystocele, rectocele, uterine prolapse    Plan:   1. Pessary is removed, cleaned, and reinserted 2. Continue to use Premarin 0.625 g vaginally 2 times per week  3. Return in 8 weeks for pessary maintenance  A total of 15 minutes were spent face-to-face with the patient during this encounter and over half of that time dealt with counseling and coordination of care.  Brayton Mars, MD

## 2017-03-09 NOTE — Patient Instructions (Signed)
1.  Return in 8 weeks for pessary maintenance 

## 2017-04-06 DIAGNOSIS — R110 Nausea: Secondary | ICD-10-CM | POA: Diagnosis not present

## 2017-04-06 DIAGNOSIS — I493 Ventricular premature depolarization: Secondary | ICD-10-CM | POA: Diagnosis not present

## 2017-04-06 DIAGNOSIS — L658 Other specified nonscarring hair loss: Secondary | ICD-10-CM | POA: Diagnosis not present

## 2017-04-06 DIAGNOSIS — S5011XS Contusion of right forearm, sequela: Secondary | ICD-10-CM | POA: Diagnosis not present

## 2017-05-04 ENCOUNTER — Ambulatory Visit (INDEPENDENT_AMBULATORY_CARE_PROVIDER_SITE_OTHER): Payer: Medicare Other | Admitting: Obstetrics and Gynecology

## 2017-05-04 ENCOUNTER — Encounter: Payer: Self-pay | Admitting: Obstetrics and Gynecology

## 2017-05-04 VITALS — BP 165/62 | HR 79 | Ht 60.0 in | Wt 142.8 lb

## 2017-05-04 DIAGNOSIS — N816 Rectocele: Secondary | ICD-10-CM | POA: Diagnosis not present

## 2017-05-04 DIAGNOSIS — N8111 Cystocele, midline: Secondary | ICD-10-CM | POA: Diagnosis not present

## 2017-05-04 DIAGNOSIS — N898 Other specified noninflammatory disorders of vagina: Secondary | ICD-10-CM

## 2017-05-04 DIAGNOSIS — Z4689 Encounter for fitting and adjustment of other specified devices: Secondary | ICD-10-CM

## 2017-05-04 NOTE — Progress Notes (Signed)
Kessler E Carteris an89y.o. femalehere todayfor an 8week pessary maintenance follow up (last visit 03/09/2017):   Chief Concerns:  1. h/o vaginal erosion  2. Gel horn Pessary Maintenance 3. Cystocele, rectocele and uterine prolapse   Patient is using Premarin cream intravaginal twice a week. Patient denies vaginal bleeding, vaginal discharge, vaginal odor, pelvic pain.  No complete obstetric history on file, patient states she has 6 kids.  Past medical history, past surgical history, problem list, medications, and allergies are reviewed Patient has recently been on a steroid Dosepak because of contact allergic reaction to pads and pull-ups.  OBJECTIVE: BP (!) 155/76   Pulse 96   Ht 5' (1.524 m)   Wt 139 lb 11.2 oz (63.4 kg)   BMI 27.28 kg/m  CONSTITUTIONAL: Well-developed, well-nourished female in no acute distress.  CARDIOVASCULAR:Not examined RESPIRATORY:Not examined BREASTS: Not Examined PELVIC: External Genitalia: Normal BUS: Normal Vagina: Several areas of vaginal hyperemia on both right and left sides which are likely exacerbated due to pessary removal; minimally friable; grade 3 cystocele; large rectocele Cervix: Normal; no lesions; parous; no cervical motion tenderness; Small cervical polyp at 7:00 Uterus: Normal size, shape,consistency, mobile. Non tender, .non enlarged Adnexa: Normal; nonpalpable and nontender RV: Normal external exam Bladder: Nontender  PROCEDURE: Pessary is removed, cleaned, reinserted  Assessment:   1. 8week pessary maintenance check, Stable 2. Vaginal atrophy with friability without evidence of infection or ulceration 3. Cystocele, rectocele, uterine prolapse    Plan:   1. Pessary is removed, cleaned, and reinserted 2. Continue to use Premarin 0.625 g vaginally 2 times per week  3. Return in 8 weeks for pessary  maintenance  A total of 15 minutes were spent face-to-face with the patient during this encounter and over half of that time dealt with counseling and coordination of care.  Brayton Mars, MD  Note: This dictation was prepared with Dragon dictation along with smaller phrase technology. Any transcriptional errors that result from this process are unintentional.

## 2017-05-04 NOTE — Patient Instructions (Signed)
1. Return in a weeks for pessary maintenance

## 2017-05-14 DIAGNOSIS — I359 Nonrheumatic aortic valve disorder, unspecified: Secondary | ICD-10-CM | POA: Diagnosis not present

## 2017-05-14 DIAGNOSIS — N814 Uterovaginal prolapse, unspecified: Secondary | ICD-10-CM | POA: Diagnosis not present

## 2017-05-14 DIAGNOSIS — Z23 Encounter for immunization: Secondary | ICD-10-CM | POA: Diagnosis not present

## 2017-05-14 DIAGNOSIS — L821 Other seborrheic keratosis: Secondary | ICD-10-CM | POA: Diagnosis not present

## 2017-05-14 DIAGNOSIS — I10 Essential (primary) hypertension: Secondary | ICD-10-CM | POA: Diagnosis not present

## 2017-06-07 DIAGNOSIS — L659 Nonscarring hair loss, unspecified: Secondary | ICD-10-CM | POA: Diagnosis not present

## 2017-06-07 DIAGNOSIS — L308 Other specified dermatitis: Secondary | ICD-10-CM | POA: Diagnosis not present

## 2017-06-07 DIAGNOSIS — S51011A Laceration without foreign body of right elbow, initial encounter: Secondary | ICD-10-CM | POA: Diagnosis not present

## 2017-06-07 DIAGNOSIS — E119 Type 2 diabetes mellitus without complications: Secondary | ICD-10-CM | POA: Diagnosis not present

## 2017-06-29 ENCOUNTER — Encounter: Payer: Medicare Other | Admitting: Obstetrics and Gynecology

## 2017-07-05 ENCOUNTER — Encounter: Payer: Self-pay | Admitting: Obstetrics and Gynecology

## 2017-07-05 ENCOUNTER — Ambulatory Visit (INDEPENDENT_AMBULATORY_CARE_PROVIDER_SITE_OTHER): Payer: Medicare Other | Admitting: Obstetrics and Gynecology

## 2017-07-05 VITALS — BP 157/83 | HR 83 | Ht 60.0 in | Wt 143.9 lb

## 2017-07-05 DIAGNOSIS — N816 Rectocele: Secondary | ICD-10-CM | POA: Diagnosis not present

## 2017-07-05 DIAGNOSIS — N814 Uterovaginal prolapse, unspecified: Secondary | ICD-10-CM | POA: Diagnosis not present

## 2017-07-05 DIAGNOSIS — N8111 Cystocele, midline: Secondary | ICD-10-CM | POA: Diagnosis not present

## 2017-07-05 DIAGNOSIS — Z4689 Encounter for fitting and adjustment of other specified devices: Secondary | ICD-10-CM | POA: Diagnosis not present

## 2017-07-05 NOTE — Progress Notes (Signed)
Chief complaint: 1.  Pessary maintenance-gel horn pessary 2.  History of vaginal erosion 3.  Cystocele, rectocele, and uterine prolapse    Tonya E Carteris an89y.o. femalehere todayfor an 8week pessary maintenance follow up (last visit 05/04/2017):  Patient reports not using the Premarin cream intravaginal for the past month because she could not afford the medication.  She is given some samples today She reports no significant vaginal bleeding, discharge, odor, or pelvic pain  Allure has had a recent outbreak of hives and rash of uncertain etiology.  She is on medications for control of symptomatology and it appears to be resolving.  Past medical history, past surgical history, problem list, medications, and allergies are reviewed  OBJECTIVE: BP (!) 157/83   Pulse 83   Ht 5' (1.524 m)   Wt 143 lb 14.4 oz (65.3 kg)   BMI 28.10 kg/m  Pleasant elderly female in no acute distress.  Slightly anxious. Abdomen: Soft, nontender Pelvic exam: External genitalia-normal BUS-normal Vagina-several areas of hyperemia are noted on the right and left sides of the vagina without erosion being present.  Slight bloody vaginal discharge is noted on palpation with Fox swab.  Cystocele, grade 3; large rectocele Cervix-no lesions; parous; no cervical motion tenderness; small polyp at 7:00 Uterus-normal size shape and consistency; nontender Adnexa-nonpalpable and nontender Rectovaginal-normal external exam  PROCEDURE: Gel horn pessary is removed, cleaned, and reinserted  ASSESSMENT: 1.  Cystocele, third degree; rectocele, moderate; uterine prolapse 2.  Vaginal atrophy with a mild friability 3.  8-week pessary maintenance-stable  PLAN: 1.  Pessary is removed, cleaned, and reinserted 2.  Samples of Premarin cream intravaginal are given, to be applied weekly 3.  Return in 8 weeks for pessary maintenance  A total of 15 minutes were spent face-to-face with the patient during this encounter and  over half of that time dealt with counseling and coordination of care.  Brayton Mars, MD  Note: This dictation was prepared with Dragon dictation along with smaller phrase technology. Any transcriptional errors that result from this process are unintentional.

## 2017-07-05 NOTE — Patient Instructions (Signed)
1.  Insert Premarin cream intravaginally once a week 2.  Return in 8 weeks for pessary maintenance 3.  Contact our office if she runs out of Premarin cream.

## 2017-09-06 DIAGNOSIS — L308 Other specified dermatitis: Secondary | ICD-10-CM | POA: Diagnosis not present

## 2017-09-06 DIAGNOSIS — E119 Type 2 diabetes mellitus without complications: Secondary | ICD-10-CM | POA: Diagnosis not present

## 2017-09-06 DIAGNOSIS — L658 Other specified nonscarring hair loss: Secondary | ICD-10-CM | POA: Diagnosis not present

## 2017-09-06 DIAGNOSIS — S5011XS Contusion of right forearm, sequela: Secondary | ICD-10-CM | POA: Diagnosis not present

## 2017-09-15 ENCOUNTER — Ambulatory Visit (INDEPENDENT_AMBULATORY_CARE_PROVIDER_SITE_OTHER): Payer: Medicare Other | Admitting: Obstetrics and Gynecology

## 2017-09-15 ENCOUNTER — Encounter: Payer: Self-pay | Admitting: Obstetrics and Gynecology

## 2017-09-15 VITALS — BP 147/86 | HR 99 | Ht 60.0 in | Wt 138.7 lb

## 2017-09-15 DIAGNOSIS — N816 Rectocele: Secondary | ICD-10-CM

## 2017-09-15 DIAGNOSIS — N814 Uterovaginal prolapse, unspecified: Secondary | ICD-10-CM | POA: Diagnosis not present

## 2017-09-15 DIAGNOSIS — Z4689 Encounter for fitting and adjustment of other specified devices: Secondary | ICD-10-CM | POA: Diagnosis not present

## 2017-09-15 DIAGNOSIS — N8111 Cystocele, midline: Secondary | ICD-10-CM | POA: Diagnosis not present

## 2017-09-15 NOTE — Patient Instructions (Signed)
1.  Continue with Premarin cream 1/2 g intravaginal weekly 2.  Return in 8 weeks for pessary maintenance

## 2017-09-15 NOTE — Progress Notes (Signed)
Chief complaint: 1.  Pessary maintenance-gel horn pessary 2.  History of vaginal erosion 3.  Cystocele, rectocele, and uterine prolapse  8-week pessary maintenance (last visit 07/05/2017) Patient reports no significant vaginal bleeding, vaginal odor, or vaginal discharge. Previous hives and drug eruption is resolving at this time-see previous note.  Past medical history, past surgical history, problem list, medications, and allergies are reviewed  OBJECTIVE: BP (!) 147/86   Pulse 99   Ht 5' (1.524 m)   Wt 138 lb 11.2 oz (62.9 kg)   BMI 27.09 kg/m  Pleasant elderly female in no acute distress.  Alert and oriented. Abdomen: Soft, nontender without organomegaly Pelvic exam: External genitalia-normal BUS-normal Vagina-cystocele and rectocele are noted; no significant vaginal discharge, slight malodor Cervix-no lesions; parous; no cervical motion tenderness; polyp at 7:00 Uterus-normal size shape and consistency, nontender Adnexa-nonpalpable nontender Rectovaginal-normal external exam  PROCEDURE: Gel horn pessary is removed, cleaned, and reinserted  ASSESSMENT: 1.  Cystocele, third-degree; rectocele, moderate; uterine prolapse-all controlled with gel horn pessary 2.  Vaginal atrophy with chronic mild friability 3.  Normal 8-week pessary maintenance-stable  PLAN: 1.  Pessary is removed, cleaned, and reinserted 2.  Samples of Premarin cream intravaginal are given-to be applied weekly 3.  Return in 8 weeks for pessary maintenance  A total of 15 minutes were spent face-to-face with the patient during this encounter and over half of that time dealt with counseling and coordination of care.  Brayton Mars, MD  Note: This dictation was prepared with Dragon dictation along with smaller phrase technology. Any transcriptional errors that result from this process are unintentional.

## 2017-11-10 ENCOUNTER — Ambulatory Visit (INDEPENDENT_AMBULATORY_CARE_PROVIDER_SITE_OTHER): Payer: Medicare Other | Admitting: Obstetrics and Gynecology

## 2017-11-10 ENCOUNTER — Encounter: Payer: Self-pay | Admitting: Obstetrics and Gynecology

## 2017-11-10 VITALS — BP 142/67 | HR 94 | Ht 60.0 in | Wt 137.9 lb

## 2017-11-10 DIAGNOSIS — N814 Uterovaginal prolapse, unspecified: Secondary | ICD-10-CM | POA: Diagnosis not present

## 2017-11-10 DIAGNOSIS — N8111 Cystocele, midline: Secondary | ICD-10-CM | POA: Diagnosis not present

## 2017-11-10 DIAGNOSIS — Z4689 Encounter for fitting and adjustment of other specified devices: Secondary | ICD-10-CM | POA: Diagnosis not present

## 2017-11-10 DIAGNOSIS — N816 Rectocele: Secondary | ICD-10-CM

## 2017-11-10 NOTE — Patient Instructions (Addendum)
1.  Return in 8 weeks for pessary maintenance 2.  Continue Premarin cream intravaginal once a week

## 2017-11-10 NOTE — Progress Notes (Signed)
Chief complaint: 1.  Pessary maintenance-gel horn pessary 2.  History of vaginal erosion 3.  Cystocele, rectocele, and uterine prolapse  8-week pessary maintenance (last visit 09/15/2017) Patient reports no significant vaginal bleeding, vaginal odor, or vaginal discharge.  Past medical history, past surgical history, problem list, medications, and allergies are reviewed  OBJECTIVE: BP (!) 142/67   Pulse 94   Ht 5' (1.524 m)   Wt 137 lb 14.4 oz (62.6 kg)   BMI 26.93 kg/m  Pleasant elderly female in no acute distress.  Alert and oriented. Abdomen: Soft, nontender without organomegaly Pelvic exam: External genitalia-normal BUS-normal Vagina-cystocele and rectocele are noted; no significant vaginal discharge, no malodor Cervix-no lesions; parous; no cervical motion tenderness; polyp at 7:00 Uterus-normal size shape and consistency, nontender Adnexa-nonpalpable nontender Rectovaginal-normal external exam  PROCEDURE: Gel horn pessary is removed, cleaned, and reinserted  ASSESSMENT: 1.  Cystocele, third-degree; rectocele, moderate; uterine prolapse-all controlled with gel horn pessary 2.  Vaginal atrophy with chronic mild friability 3.  Normal 8-week pessary maintenance-stable  PLAN: 1.  Pessary is removed, cleaned, and reinserted 2.  Premarin intravaginal weekly as directed 3.  Return in 8 weeks for pessary maintenance  A total of 15 minutes were spent face-to-face with the patient during this encounter and over half of that time dealt with counseling and coordination of care.  Brayton Mars, MD  Note: This dictation was prepared with Dragon dictation along with smaller phrase technology. Any transcriptional errors that result from this process are unintentional.

## 2017-11-22 DIAGNOSIS — L821 Other seborrheic keratosis: Secondary | ICD-10-CM | POA: Diagnosis not present

## 2017-11-22 DIAGNOSIS — L658 Other specified nonscarring hair loss: Secondary | ICD-10-CM | POA: Diagnosis not present

## 2017-11-22 DIAGNOSIS — E119 Type 2 diabetes mellitus without complications: Secondary | ICD-10-CM | POA: Diagnosis not present

## 2017-11-22 DIAGNOSIS — L308 Other specified dermatitis: Secondary | ICD-10-CM | POA: Diagnosis not present

## 2017-11-24 DIAGNOSIS — L658 Other specified nonscarring hair loss: Secondary | ICD-10-CM | POA: Diagnosis not present

## 2017-11-24 DIAGNOSIS — I10 Essential (primary) hypertension: Secondary | ICD-10-CM | POA: Diagnosis not present

## 2017-11-24 DIAGNOSIS — L308 Other specified dermatitis: Secondary | ICD-10-CM | POA: Diagnosis not present

## 2017-11-24 DIAGNOSIS — L821 Other seborrheic keratosis: Secondary | ICD-10-CM | POA: Diagnosis not present

## 2017-12-06 DIAGNOSIS — L309 Dermatitis, unspecified: Secondary | ICD-10-CM | POA: Diagnosis not present

## 2017-12-06 DIAGNOSIS — R21 Rash and other nonspecific skin eruption: Secondary | ICD-10-CM | POA: Diagnosis not present

## 2017-12-15 DIAGNOSIS — L821 Other seborrheic keratosis: Secondary | ICD-10-CM | POA: Diagnosis not present

## 2017-12-15 DIAGNOSIS — L820 Inflamed seborrheic keratosis: Secondary | ICD-10-CM | POA: Diagnosis not present

## 2017-12-15 DIAGNOSIS — L239 Allergic contact dermatitis, unspecified cause: Secondary | ICD-10-CM | POA: Diagnosis not present

## 2017-12-23 DIAGNOSIS — L209 Atopic dermatitis, unspecified: Secondary | ICD-10-CM | POA: Diagnosis not present

## 2017-12-23 DIAGNOSIS — L509 Urticaria, unspecified: Secondary | ICD-10-CM | POA: Diagnosis not present

## 2018-01-05 ENCOUNTER — Encounter: Payer: Self-pay | Admitting: Obstetrics and Gynecology

## 2018-01-05 ENCOUNTER — Ambulatory Visit (INDEPENDENT_AMBULATORY_CARE_PROVIDER_SITE_OTHER): Payer: Medicare Other | Admitting: Obstetrics and Gynecology

## 2018-01-05 VITALS — BP 125/67 | HR 105 | Ht 60.0 in | Wt 130.3 lb

## 2018-01-05 DIAGNOSIS — Z4689 Encounter for fitting and adjustment of other specified devices: Secondary | ICD-10-CM

## 2018-01-05 DIAGNOSIS — N816 Rectocele: Secondary | ICD-10-CM

## 2018-01-05 DIAGNOSIS — N814 Uterovaginal prolapse, unspecified: Secondary | ICD-10-CM

## 2018-01-05 DIAGNOSIS — N8111 Cystocele, midline: Secondary | ICD-10-CM | POA: Diagnosis not present

## 2018-01-05 NOTE — Progress Notes (Signed)
Chief complaint: 1.  Pessary maintenance-gel 1 pessary 2.  Prior history of vaginal erosion 3.  Cystocele, rectocele, and uterine prolapse  8-week pessary maintenance (last visit 4 34,019 Patient reports no significant vaginal bleeding, vaginal odor, or vaginal discharge. Interval health issues include chronic skin rash/ulceration, unclear etiology; completing prednisone therapy with marked improvement in skin condition. Patient notes some instability in her gait, otherwise has tolerated the prednisone Dosepak well. Bowel function is normal. Bladder function is normal. Patient denies vaginal bleeding, vaginal discharge, vaginal odor.  Past Medical History:  Diagnosis Date  . Cervical polyp   . Cystocele   . Cystocele   . Diabetes mellitus without complication ([place])   . Hypertension   . Insomnia   . Procidentia of uterus   . Rectocele   . Uterine prolapse   . Vaginal abrasion   . Vaginal atrophy   . Vaginal erosion    secondary to pessary use   Past Surgical History:  Procedure Laterality Date  . TUBAL LIGATION      Review of systems: Comprehensive review of systems is negative except for that noted in the HPI  OBJECTIVE: BP 125/67   Pulse (!) 105   Ht 5' (1.524 m)   Wt 130 lb 4.8 oz (59.1 kg)   BMI 25.45 kg/m  Pleasant female no acute distress.  Affect is appropriate.  She is alert and oriented. Abdomen: Soft, nontender without organomegaly Pelvic exam: External genitalia-mild atrophic changes BUS-normal Vagina-minimal malodor; no significant discharge; cystocele and rectocele are noted Cervix-parous; no lesions or ulcerations; no cervical motion tenderness; cervical polyp at 7:00 persists Uterus-midplane, normal size and shape, mobile, nontender, nonenlarged Adnexa-nonpalpable nontender Rectovaginal-normal external exam  PROCEDURE: Gellhorn pessary is removed, cleaned, and reinserted  ASSESSMENT: 1.  Cystocele, third-degree; rectocele, moderate; uterine  prolapse-controlled with gel horn pessary 2.  Vaginal atrophy and history of chronic vaginal friability, asymptomatic today 3.  Pessary maintenance, 8 weeks, stable  PLAN: 1.  Pessary is removed, cleaned, and reinserted 2.  Premarin cream intravaginal weekly is encouraged 3.  Return in 8 weeks for pessary maintenance 4.  Complete Dosepak prednisone as written for chronic skin condition  A total of 15 minutes were spent face-to-face with the patient during this encounter and over half of that time dealt with counseling and coordination of care.  Brayton Mars, MD  Note: This dictation was prepared with Dragon dictation along with smaller phrase technology. Any transcriptional errors that result from this process are unintentional.

## 2018-01-05 NOTE — Patient Instructions (Addendum)
1.  Return in 8 weeks for pessary maintenance. 2.  Continue using Premarin cream intravaginal weekly

## 2018-01-06 DIAGNOSIS — L853 Xerosis cutis: Secondary | ICD-10-CM | POA: Diagnosis not present

## 2018-01-06 DIAGNOSIS — L308 Other specified dermatitis: Secondary | ICD-10-CM | POA: Diagnosis not present

## 2018-01-06 DIAGNOSIS — L209 Atopic dermatitis, unspecified: Secondary | ICD-10-CM | POA: Diagnosis not present

## 2018-01-13 DIAGNOSIS — S5011XS Contusion of right forearm, sequela: Secondary | ICD-10-CM | POA: Diagnosis not present

## 2018-01-13 DIAGNOSIS — I10 Essential (primary) hypertension: Secondary | ICD-10-CM | POA: Diagnosis not present

## 2018-01-13 DIAGNOSIS — L308 Other specified dermatitis: Secondary | ICD-10-CM | POA: Diagnosis not present

## 2018-01-13 DIAGNOSIS — L658 Other specified nonscarring hair loss: Secondary | ICD-10-CM | POA: Diagnosis not present

## 2018-02-17 DIAGNOSIS — Z872 Personal history of diseases of the skin and subcutaneous tissue: Secondary | ICD-10-CM | POA: Diagnosis not present

## 2018-03-02 ENCOUNTER — Ambulatory Visit (INDEPENDENT_AMBULATORY_CARE_PROVIDER_SITE_OTHER): Payer: Medicare Other | Admitting: Obstetrics and Gynecology

## 2018-03-02 ENCOUNTER — Encounter: Payer: Self-pay | Admitting: Obstetrics and Gynecology

## 2018-03-02 VITALS — BP 136/76 | HR 99 | Ht 60.0 in | Wt 130.3 lb

## 2018-03-02 DIAGNOSIS — N816 Rectocele: Secondary | ICD-10-CM

## 2018-03-02 DIAGNOSIS — Z4689 Encounter for fitting and adjustment of other specified devices: Secondary | ICD-10-CM

## 2018-03-02 DIAGNOSIS — N8111 Cystocele, midline: Secondary | ICD-10-CM | POA: Diagnosis not present

## 2018-03-02 DIAGNOSIS — N814 Uterovaginal prolapse, unspecified: Secondary | ICD-10-CM

## 2018-03-02 DIAGNOSIS — T8389XD Other specified complication of genitourinary prosthetic devices, implants and grafts, subsequent encounter: Secondary | ICD-10-CM

## 2018-03-02 DIAGNOSIS — N898 Other specified noninflammatory disorders of vagina: Secondary | ICD-10-CM

## 2018-03-02 NOTE — Patient Instructions (Signed)
1.  Return in 6 weeks for pessary maintenance 2.  Continue using Trimosan gel intravaginal weekly

## 2018-03-04 NOTE — Progress Notes (Signed)
Chief complaint: 1.  Pessary maintenance 2.  Cystocele, rectocele, and uterine prolapse 3.  History of vaginal erosion  8-week pessary maintenance (last visit 01/05/2018). Patient reports no significant new symptoms regarding her pessary.  She denies vaginal bleeding, vaginal odor, or vaginal discharge. Chronic skin condition previously noted has been improving since prednisone therapy. Patient is unhappy with recommendations by other providers recommending that she limit her activities.  She is frustrated because she is told that she cannot sew, knit, etc because of fear that she may cut herself.  Past medical history, past surgical history, problem list, medications, and allergies are reviewed  OBJECTIVE: BP 136/76   Pulse 99   Ht 5' (1.524 m)   Wt 130 lb 4.8 oz (59.1 kg)   BMI 25.45 kg/m  Pleasant elderly female with slow and slightly unsteady gait.  He is alert and oriented. Abdomen: Soft, nontender Pelvic exam: External genitalia with atrophic changes BUS is normal. Vagina-minimal malodor; no significant discharge; cystocele and rectocele are unchanged.  No erosions are noted Cervix-parous; no lesions or ulcerations; no cervical motion tenderness; cervical polyp at 7:00 unchanged. Uterus-midplane small mobile posterior nonenlarged nontender Adnexa-nonpalpable nontender Rectovaginal-normal external exam  PROCEDURE: Gellhorn pessary is removed, cleaned, and reinserted  ASSESSMENT: 1.  Cystocele, third-degree, rectocele, moderate; uterine prolapse-controlled with gel 1 pessary 2.  No new vaginal erosions 3.  Pessary maintenance is completed  PLAN: 1.  Pessary is removed, cleaned, and reinserted 2.  Patient is encouraged to use Trimosan gel intravaginal on a weekly basis 3.  Return in 8 weeks for follow-up 4.  Listened to patient's psychosocial concerns regarding aging  A total of 15 minutes were spent face-to-face with the patient during this encounter and over half of  that time dealt with counseling and coordination of care.  Brayton Mars, MD  Note: This dictation was prepared with Dragon dictation along with smaller phrase technology. Any transcriptional errors that result from this process are unintentional.

## 2018-03-16 DIAGNOSIS — I10 Essential (primary) hypertension: Secondary | ICD-10-CM | POA: Diagnosis not present

## 2018-03-16 DIAGNOSIS — L308 Other specified dermatitis: Secondary | ICD-10-CM | POA: Diagnosis not present

## 2018-03-16 DIAGNOSIS — L658 Other specified nonscarring hair loss: Secondary | ICD-10-CM | POA: Diagnosis not present

## 2018-03-16 DIAGNOSIS — S5011XS Contusion of right forearm, sequela: Secondary | ICD-10-CM | POA: Diagnosis not present

## 2018-03-30 DIAGNOSIS — I10 Essential (primary) hypertension: Secondary | ICD-10-CM | POA: Diagnosis not present

## 2018-03-30 DIAGNOSIS — L308 Other specified dermatitis: Secondary | ICD-10-CM | POA: Diagnosis not present

## 2018-03-30 DIAGNOSIS — L658 Other specified nonscarring hair loss: Secondary | ICD-10-CM | POA: Diagnosis not present

## 2018-03-30 DIAGNOSIS — S5011XS Contusion of right forearm, sequela: Secondary | ICD-10-CM | POA: Diagnosis not present

## 2018-04-13 ENCOUNTER — Encounter: Payer: Medicare Other | Admitting: Obstetrics and Gynecology

## 2018-04-15 DIAGNOSIS — L308 Other specified dermatitis: Secondary | ICD-10-CM | POA: Diagnosis not present

## 2018-04-15 DIAGNOSIS — S5011XS Contusion of right forearm, sequela: Secondary | ICD-10-CM | POA: Diagnosis not present

## 2018-04-15 DIAGNOSIS — Z0000 Encounter for general adult medical examination without abnormal findings: Secondary | ICD-10-CM | POA: Diagnosis not present

## 2018-04-15 DIAGNOSIS — L658 Other specified nonscarring hair loss: Secondary | ICD-10-CM | POA: Diagnosis not present

## 2018-04-15 DIAGNOSIS — I10 Essential (primary) hypertension: Secondary | ICD-10-CM | POA: Diagnosis not present

## 2018-04-20 ENCOUNTER — Encounter: Payer: Self-pay | Admitting: Obstetrics and Gynecology

## 2018-04-20 ENCOUNTER — Ambulatory Visit (INDEPENDENT_AMBULATORY_CARE_PROVIDER_SITE_OTHER): Payer: Medicare Other | Admitting: Obstetrics and Gynecology

## 2018-04-20 VITALS — BP 119/68 | HR 99 | Ht 64.0 in | Wt 126.3 lb

## 2018-04-20 DIAGNOSIS — N816 Rectocele: Secondary | ICD-10-CM | POA: Diagnosis not present

## 2018-04-20 DIAGNOSIS — Z4689 Encounter for fitting and adjustment of other specified devices: Secondary | ICD-10-CM

## 2018-04-20 DIAGNOSIS — N814 Uterovaginal prolapse, unspecified: Secondary | ICD-10-CM

## 2018-04-20 DIAGNOSIS — N8111 Cystocele, midline: Secondary | ICD-10-CM | POA: Diagnosis not present

## 2018-04-20 NOTE — Patient Instructions (Signed)
1.  The pessary is removed, cleaned, and reinserted today. 2.  Recommend inserting Trimosan gel intravaginal weekly 3.  Return in 10 weeks for pessary maintenance

## 2018-04-20 NOTE — Progress Notes (Signed)
Chief complaint: 1.  Pessary maintenance 2.  Cystocele, rectocele, and uterine prolapse 3.  History of vaginal erosion  Pessary maintenance-10 weeks's (last visit 03/02/2018) Pessary: Gel horn Medications: Trimosan gel intravaginal weekly Symptoms: Patient denies vaginal bleeding, vaginal odor, vaginal discharge, or pelvic pain.  Past Medical History:  Diagnosis Date  . Cervical polyp   . Cystocele   . Cystocele   . Diabetes mellitus without complication ([place])   . Hypertension   . Insomnia   . Procidentia of uterus   . Rectocele   . Uterine prolapse   . Vaginal abrasion   . Vaginal atrophy   . Vaginal erosion    secondary to pessary use   Past Surgical History:  Procedure Laterality Date  . TUBAL LIGATION     Review of systems: Complete review of systems is negative. OBJECTIVE: BP 119/68   Pulse 99   Ht 5\' 4"  (1.626 m)   Wt 126 lb 4.8 oz (57.3 kg)   BMI 21.68 kg/m  Pleasant well-appearing female no acute distress.  Alert and oriented. Back: No CVA tenderness Abdomen: Soft, nontender Pelvic exam: External genitalia-atrophic BUS-normal Vagina-minimal malodor; no significant discharge or bleeding; cystocele and rectocele are unchanged; no ulcerations or erosions are noted.  Mild posterior vaginal hyperemia is noted without ulceration. Cervix-parous; no cervical motion tenderness; cervical polyp at 7:00 Uterus-midplane, mobile, retroverted, normal size and shape, nontender Adnexa-nonpalpable nontender Rectovaginal-normal external exam  PROCEDURE: Pessary is removed, cleaned, and reinserted  ASSESSMENT: 1.  Cystocele, third-degree; rectocele, moderate; uterine prolapse-controlled with gel horn pessary 2.  No new or recurrent vaginal erosions 3.  Normal pessary maintenance  PLAN: 1.  Pessary is removed, cleaned, and reinserted 2.  Recommend Trimosan gel intravaginal weekly 3.  Return in 10 weeks for follow-up  A total of 15 minutes were spent face-to-face with  the patient during this encounter and over half of that time dealt with counseling and coordination of care.  Brayton Mars, MD  Note: This dictation was prepared with Dragon dictation along with smaller phrase technology. Any transcriptional errors that result from this process are unintentional.

## 2018-05-23 DIAGNOSIS — E113393 Type 2 diabetes mellitus with moderate nonproliferative diabetic retinopathy without macular edema, bilateral: Secondary | ICD-10-CM | POA: Diagnosis not present

## 2018-06-03 DIAGNOSIS — E119 Type 2 diabetes mellitus without complications: Secondary | ICD-10-CM | POA: Diagnosis not present

## 2018-06-03 DIAGNOSIS — L821 Other seborrheic keratosis: Secondary | ICD-10-CM | POA: Diagnosis not present

## 2018-06-03 DIAGNOSIS — L658 Other specified nonscarring hair loss: Secondary | ICD-10-CM | POA: Diagnosis not present

## 2018-06-03 DIAGNOSIS — I10 Essential (primary) hypertension: Secondary | ICD-10-CM | POA: Diagnosis not present

## 2018-06-29 ENCOUNTER — Encounter: Payer: Self-pay | Admitting: Obstetrics and Gynecology

## 2018-06-29 ENCOUNTER — Ambulatory Visit (INDEPENDENT_AMBULATORY_CARE_PROVIDER_SITE_OTHER): Payer: Medicare Other | Admitting: Obstetrics and Gynecology

## 2018-06-29 VITALS — BP 149/80 | HR 86 | Ht 64.0 in | Wt 108.9 lb

## 2018-06-29 DIAGNOSIS — N814 Uterovaginal prolapse, unspecified: Secondary | ICD-10-CM

## 2018-06-29 DIAGNOSIS — N898 Other specified noninflammatory disorders of vagina: Secondary | ICD-10-CM

## 2018-06-29 DIAGNOSIS — N8111 Cystocele, midline: Secondary | ICD-10-CM | POA: Diagnosis not present

## 2018-06-29 DIAGNOSIS — N816 Rectocele: Secondary | ICD-10-CM

## 2018-06-29 DIAGNOSIS — T8389XD Other specified complication of genitourinary prosthetic devices, implants and grafts, subsequent encounter: Secondary | ICD-10-CM

## 2018-06-29 NOTE — Patient Instructions (Signed)
1.  Continue using Trimosan gel intravaginal weekly 2.  Return in 10 weeks for pessary maintenance with Dr. Amalia Hailey 3.  The gel horn pessary is removed, cleaned, and reinserted today.

## 2018-06-29 NOTE — Progress Notes (Signed)
Chief complaint: 1.  Pessary maintenance 2.  Cystocele, rectocele, and uterine prolapse 3.  History of vaginal erosion  Pessary maintenance-10- weeks (last visit 04/20/2018) Pessary: Gel horn Medications: Trimosan gel intravaginal weekly Symptoms: Patient denies vaginal bleeding, vaginal odor, vaginal discharge, or pelvic pain.      Past Medical History:  Diagnosis Date  . Cervical polyp   . Cystocele   . Cystocele   . Diabetes mellitus without complication ([place])   . Hypertension   . Insomnia   . Procidentia of uterus   . Rectocele   . Uterine prolapse   . Vaginal abrasion   . Vaginal atrophy   . Vaginal erosion    secondary to pessary use        Past Surgical History:  Procedure Laterality Date  . TUBAL LIGATION     Review of systems: Complete review of systems is negative.  OBJECTIVE: BP (!) 149/80   Pulse 86   Ht 5\' 4"  (1.626 m)   Wt 108 lb 14.4 oz (49.4 kg)   BMI 18.69 kg/m   Pleasant well-appearing female no acute distress.  Alert and oriented. Back: No CVA tenderness Abdomen: Soft, nontender Pelvic exam: External genitalia-atrophic BUS-normal Vagina-no malodor; no significant discharge or bleeding; cystocele and rectocele are unchanged; no ulcerations or erosions are noted.  Mild posterior vaginal hyperemia is noted with minimal friablity. Cervix-parous; no cervical motion tenderness; cervical polyp at 7:00 Uterus-midplane, mobile, retroverted, normal size and shape, nontender Adnexa-nonpalpable nontender Rectovaginal-normal external exam  PROCEDURE: Gellhorn pessary is removed, cleaned, and reinserted  ASSESSMENT: 1.  Cystocele, third-degree; rectocele, moderate; uterine prolapse-controlled with gel horn pessary 2.  No new or recurrent vaginal erosions; mild vaginal hyperemia with friability; no infection 3.  Normal pessary maintenance  PLAN: 1.  Pessary is removed, cleaned, and reinserted 2.  Recommend Trimosan gel  intravaginal weekly 3.  Return in 10 weeks for follow-up-Dr. Amalia Hailey  A total of 15 minutes were spent face-to-face with the patient during this encounter and over half of that time dealt with counseling and coordination of care.  Brayton Mars, MD  Note: This dictation was prepared with Dragon dictation along with smaller phrase technology. Any transcriptional errors that result from this process are unintentional.

## 2018-09-02 DIAGNOSIS — L659 Nonscarring hair loss, unspecified: Secondary | ICD-10-CM | POA: Diagnosis not present

## 2018-09-02 DIAGNOSIS — I359 Nonrheumatic aortic valve disorder, unspecified: Secondary | ICD-10-CM | POA: Diagnosis not present

## 2018-09-02 DIAGNOSIS — E119 Type 2 diabetes mellitus without complications: Secondary | ICD-10-CM | POA: Diagnosis not present

## 2018-09-02 DIAGNOSIS — L821 Other seborrheic keratosis: Secondary | ICD-10-CM | POA: Diagnosis not present

## 2018-09-07 ENCOUNTER — Ambulatory Visit (INDEPENDENT_AMBULATORY_CARE_PROVIDER_SITE_OTHER): Payer: Medicare Other | Admitting: Obstetrics and Gynecology

## 2018-09-07 ENCOUNTER — Encounter: Payer: Self-pay | Admitting: Obstetrics and Gynecology

## 2018-09-07 VITALS — BP 156/79 | HR 93 | Wt 128.7 lb

## 2018-09-07 DIAGNOSIS — Z4689 Encounter for fitting and adjustment of other specified devices: Secondary | ICD-10-CM | POA: Diagnosis not present

## 2018-09-07 NOTE — Progress Notes (Signed)
HPI:      [name] is a 85 y.o. No obstetric history on file. who LMP was No LMP recorded. Patient is postmenopausal.  Subjective:   She presents today for pessary care.  She reports no problems.  She says she is using estrogen cream sometimes.    Hx: The following portions of the patient's history were reviewed and updated as appropriate:             She  has a past medical history of Cervical polyp, Cystocele, Cystocele, Diabetes mellitus without complication ([place]), Hypertension, Insomnia, Procidentia of uterus, Rectocele, Uterine prolapse, Vaginal abrasion, Vaginal atrophy, and Vaginal erosion. She does not have any pertinent problems on file. She  has a past surgical history that includes Tubal ligation. Her family history is not on file. She  reports that she has never smoked. She has never used smokeless tobacco. She reports that she does not drink alcohol or use drugs. She has a current medication list which includes the following prescription(s): acyclovir, conjugated estrogens, [facility] chest congestion relief, [facility] vitamin d3, enalapril, hydrochlorothiazide, hydroxyzine, klor-con m20, loratadine, metformin, omeprazole, sertraline, and triamcinolone cream. She is allergic to penicillins.       Review of Systems:  Review of Systems  Constitutional: Denied constitutional symptoms, night sweats, recent illness, fatigue, fever, insomnia and weight loss.  Eyes: Denied eye symptoms, eye pain, photophobia, vision change and visual disturbance.  Ears/Nose/Throat/Neck: Denied ear, nose, throat or neck symptoms, hearing loss, nasal discharge, sinus congestion and sore throat.  Cardiovascular: Denied cardiovascular symptoms, arrhythmia, chest pain/pressure, edema, exercise intolerance, orthopnea and palpitations.  Respiratory: Denied pulmonary symptoms, asthma, pleuritic pain, productive sputum, cough, dyspnea and wheezing.  Gastrointestinal: Denied, gastro-esophageal reflux, melena,  nausea and vomiting.  Genitourinary: Denied genitourinary symptoms including symptomatic vaginal discharge, pelvic relaxation issues, and urinary complaints.  Musculoskeletal: Denied musculoskeletal symptoms, stiffness, swelling, muscle weakness and myalgia.  Dermatologic: Denied dermatology symptoms, rash and scar.  Neurologic: Denied neurology symptoms, dizziness, headache, neck pain and syncope.  Psychiatric: Denied psychiatric symptoms, anxiety and depression.  Endocrine: Denied endocrine symptoms including hot flashes and night sweats.   Meds:   Current Outpatient Medications on File Prior to Visit  Medication Sig Dispense Refill  . acyclovir (ZOVIRAX) 200 MG capsule TAKE 1 CAPSULE BY MOUTH THREE TIMES A DAY  1  . conjugated estrogens (PREMARIN) vaginal cream Place 0.625 g vaginally daily.    . [facility] CHEST CONGESTION RELIEF 400 MG TABS tablet Take 400 mg by mouth 2 (two) times daily.  6  . [facility] VITAMIN D3 1000 UNITS capsule Take 1,000 Units by mouth daily.  5  . enalapril (VASOTEC) 2.5 MG tablet Take 2.5 mg by mouth daily.  3  . hydrochlorothiazide (HYDRODIURIL) 25 MG tablet Take 25 mg by mouth daily.  3  . hydrOXYzine (ATARAX/VISTARIL) 10 MG tablet Take 10 mg by mouth 3 (three) times daily as needed.    Marland Kitchen KLOR-CON M20 20 MEQ tablet Take 20 mEq by mouth 2 (two) times daily.  3  . loratadine (CLARITIN) 10 MG tablet Take 10 mg by mouth daily.  4  . metFORMIN (GLUCOPHAGE) 500 MG tablet Take 500 mg by mouth 2 (two) times daily.  3  . omeprazole (PRILOSEC) 20 MG capsule   3  . sertraline (ZOLOFT) 25 MG tablet Take 25 mg by mouth daily.  4  . triamcinolone cream (KENALOG) 0.1 % APPLY CREAM TO AFFECTED AREA TWO TIMES DAILY  3   No current facility-administered medications  on file prior to visit.     Objective:     Vitals:   09/07/18 1327  BP: (!) 156/79  Pulse: 93              Pessary Care Pessary removed and cleaned.  Vagina checked by speculum exam- without erosions - pessary  replaced. Small very superficial erosion noted.    Assessment:    No obstetric history on file. Patient Active Problem List   Diagnosis Date Noted  . Squamous cell carcinoma, arm 09/16/2015  . GERD (gastroesophageal reflux disease) 03/14/2015  . Chronic hypertension 03/14/2015  . Uterine prolapse 12/20/2014  . Cystocele, midline 12/20/2014  . Rectocele 12/20/2014     1. Pessary maintenance        Plan:            1.  Strongly advised patient to use Premarin cream more systematically and at least twice a week.  Rather than 6 months I will recheck in 3 months. Orders No orders of the defined types were placed in this encounter.   No orders of the defined types were placed in this encounter.     F/U  Return in about 3 months (around 12/06/2018). I spent 16 minutes involved in the care of this patient of which greater than 50% was spent discussing pessary care and maintenance, use of estrogen vaginal cream as directed, possible vaginal erosions requiring pessary to remain out.  All questions answered.  Finis Bud, M.D. 09/07/2018 2:12 PM

## 2018-09-07 NOTE — Progress Notes (Signed)
Patient comes in today for pessary maintenance. She has gel horn in. Denies vaginal discharge, bleeding or odor.

## 2018-11-02 ENCOUNTER — Telehealth: Payer: Self-pay | Admitting: Obstetrics and Gynecology

## 2018-11-02 NOTE — Telephone Encounter (Signed)
Pt was called and informed that samples would be at the front desk for pick up.

## 2018-11-02 NOTE — Telephone Encounter (Signed)
Patients appointment had to be pushed out to June due to covid 19. She will be out of premarin cream soon and she usually gets samples at her appointment due to the cost. Her daughter/caregiver asked if she will still be able to get samples before her appointment. Thanks

## 2018-12-02 DIAGNOSIS — L821 Other seborrheic keratosis: Secondary | ICD-10-CM | POA: Diagnosis not present

## 2018-12-02 DIAGNOSIS — L659 Nonscarring hair loss, unspecified: Secondary | ICD-10-CM | POA: Diagnosis not present

## 2018-12-02 DIAGNOSIS — I359 Nonrheumatic aortic valve disorder, unspecified: Secondary | ICD-10-CM | POA: Diagnosis not present

## 2018-12-02 DIAGNOSIS — E119 Type 2 diabetes mellitus without complications: Secondary | ICD-10-CM | POA: Diagnosis not present

## 2018-12-06 ENCOUNTER — Encounter: Payer: Medicare Other | Admitting: Obstetrics and Gynecology

## 2018-12-21 ENCOUNTER — Other Ambulatory Visit: Payer: Self-pay

## 2018-12-21 ENCOUNTER — Ambulatory Visit (INDEPENDENT_AMBULATORY_CARE_PROVIDER_SITE_OTHER): Payer: Medicare Other | Admitting: Obstetrics and Gynecology

## 2018-12-21 ENCOUNTER — Encounter: Payer: Self-pay | Admitting: Obstetrics and Gynecology

## 2018-12-21 VITALS — BP 174/81 | HR 90 | Ht 60.0 in | Wt 131.9 lb

## 2018-12-21 DIAGNOSIS — Z4689 Encounter for fitting and adjustment of other specified devices: Secondary | ICD-10-CM | POA: Diagnosis not present

## 2018-12-21 NOTE — Progress Notes (Signed)
HPI:      Tonya Atkinson is a 85 y.o. No obstetric history on file. who LMP was No LMP recorded. Patient is postmenopausal.  Subjective:   She presents today for pessary maintenance.  She reports that she is using the estrogen cream twice weekly as directed.  She denies any other problems.    Hx: The following portions of the patient's history were reviewed and updated as appropriate:             She  has a past medical history of Cervical polyp, Cystocele, Cystocele, Diabetes mellitus without complication ([place]), Hypertension, Insomnia, Procidentia of uterus, Rectocele, Uterine prolapse, Vaginal abrasion, Vaginal atrophy, and Vaginal erosion. She does not have any pertinent problems on file. She  has a past surgical history that includes Tubal ligation. Her family history is not on file. She  reports that she has never smoked. She has never used smokeless tobacco. She reports that she does not drink alcohol or use drugs. She has a current medication list which includes the following prescription(s): acyclovir, conjugated estrogens, [facility]vs chest congestion relief, [facility]vs vitamin d3, enalapril, hydrochlorothiazide, hydroxyzine, klor-con m20, loratadine, metformin, omeprazole, sertraline, and triamcinolone cream. She is allergic to penicillins.       Review of Systems:  Review of Systems  Constitutional: Denied constitutional symptoms, night sweats, recent illness, fatigue, fever, insomnia and weight loss.  Eyes: Denied eye symptoms, eye pain, photophobia, vision change and visual disturbance.  Ears/Nose/Throat/Neck: Denied ear, nose, throat or neck symptoms, hearing loss, nasal discharge, sinus congestion and sore throat.  Cardiovascular: Denied cardiovascular symptoms, arrhythmia, chest pain/pressure, edema, exercise intolerance, orthopnea and palpitations.  Respiratory: Denied pulmonary symptoms, asthma, pleuritic pain, productive sputum, cough, dyspnea and wheezing.  Gastrointestinal: Denied,  gastro-esophageal reflux, melena, nausea and vomiting.  Genitourinary: Denied genitourinary symptoms including symptomatic vaginal discharge, pelvic relaxation issues, and urinary complaints.  Musculoskeletal: Denied musculoskeletal symptoms, stiffness, swelling, muscle weakness and myalgia.  Dermatologic: Denied dermatology symptoms, rash and scar.  Neurologic: Denied neurology symptoms, dizziness, headache, neck pain and syncope.  Psychiatric: Denied psychiatric symptoms, anxiety and depression.  Endocrine: Denied endocrine symptoms including hot flashes and night sweats.   Meds:   Current Outpatient Medications on File Prior to Visit  Medication Sig Dispense Refill  . acyclovir (ZOVIRAX) 200 MG capsule TAKE 1 CAPSULE BY MOUTH THREE TIMES A DAY  1  . conjugated estrogens (PREMARIN) vaginal cream Place 0.625 g vaginally daily.    . [facility] CHEST CONGESTION RELIEF 400 MG TABS tablet Take 400 mg by mouth 2 (two) times daily.  6  . [facility] VITAMIN D3 1000 UNITS capsule Take 1,000 Units by mouth daily.  5  . enalapril (VASOTEC) 2.5 MG tablet Take 2.5 mg by mouth daily.  3  . hydrochlorothiazide (HYDRODIURIL) 25 MG tablet Take 25 mg by mouth daily.  3  . hydrOXYzine (ATARAX/VISTARIL) 10 MG tablet Take 10 mg by mouth 3 (three) times daily as needed.    Marland Kitchen KLOR-CON M20 20 MEQ tablet Take 20 mEq by mouth 2 (two) times daily.  3  . loratadine (CLARITIN) 10 MG tablet Take 10 mg by mouth daily.  4  . metFORMIN (GLUCOPHAGE) 500 MG tablet Take 500 mg by mouth 2 (two) times daily.  3  . omeprazole (PRILOSEC) 20 MG capsule   3  . sertraline (ZOLOFT) 25 MG tablet Take 25 mg by mouth daily.  4  . triamcinolone cream (KENALOG) 0.1 % APPLY CREAM TO AFFECTED AREA TWO TIMES DAILY  3  No current facility-administered medications on file prior to visit.     Objective:     Vitals:   12/21/18 1014  BP: (!) 174/81  Pulse: 90              Cystocele  Pessary Care Pessary removed and cleaned.  Vagina checked by  speculum exam- without erosions - pessary replaced.    Assessment:    No obstetric history on file. Patient Active Problem List   Diagnosis Date Noted  . Squamous cell carcinoma, arm 09/16/2015  . GERD (gastroesophageal reflux disease) 03/14/2015  . Chronic hypertension 03/14/2015  . Uterine prolapse 12/20/2014  . Cystocele, midline 12/20/2014  . Rectocele 12/20/2014     1. Pessary maintenance        Plan:            1.  Discussed pessary care in detail again with the patient. Orders No orders of the defined types were placed in this encounter.   No orders of the defined types were placed in this encounter.     F/U  Return in about 4 months (around 04/22/2019). I spent 16 minutes involved in the care of this patient of which greater than 50% was spent discussing pessary care, prevention of vaginal erosions, use of estrogen, general medical health.  All questions answered  Finis Bud, M.D. 12/21/2018 10:34 AM

## 2018-12-21 NOTE — Progress Notes (Signed)
Patient come in today to have her pessary cleaned. She has not had any bleeding, discharge, or odor. She has a gel horn pessary.

## 2019-01-10 ENCOUNTER — Other Ambulatory Visit: Payer: Self-pay | Admitting: Obstetrics and Gynecology

## 2019-01-10 ENCOUNTER — Other Ambulatory Visit: Payer: Self-pay | Admitting: Surgical

## 2019-01-10 ENCOUNTER — Telehealth: Payer: Self-pay | Admitting: Obstetrics and Gynecology

## 2019-01-10 MED ORDER — ESTROGENS, CONJUGATED 0.625 MG/GM VA CREA: 0.6250 g | TOPICAL_CREAM | Freq: Every day | VAGINAL | 2 refills | Status: DC

## 2019-01-10 NOTE — Telephone Encounter (Signed)
Spoke with patients daughter and let her know that I will leave two samples up front for her. [facility] and see how much it is.

## 2019-01-10 NOTE — Telephone Encounter (Signed)
The patients daughter Bethena Roys called and stated that the patient was advised to call back to get more samples of the Premarin Cream. The pt's daughter also stated that she was given a small sample but it is now out. Please advise.

## 2019-01-10 NOTE — Progress Notes (Signed)
Samples left up front. Please see other message.

## 2019-01-17 ENCOUNTER — Other Ambulatory Visit: Payer: Self-pay | Admitting: Surgical

## 2019-01-17 MED ORDER — ESTRADIOL 0.1 MG/GM VA CREA: TOPICAL_CREAM | VAGINAL | 1 refills | Status: AC

## 2019-01-27 DIAGNOSIS — E034 Atrophy of thyroid (acquired): Secondary | ICD-10-CM | POA: Diagnosis not present

## 2019-01-27 DIAGNOSIS — E7849 Other hyperlipidemia: Secondary | ICD-10-CM | POA: Diagnosis not present

## 2019-01-27 DIAGNOSIS — E119 Type 2 diabetes mellitus without complications: Secondary | ICD-10-CM | POA: Diagnosis not present

## 2019-01-27 DIAGNOSIS — I10 Essential (primary) hypertension: Secondary | ICD-10-CM | POA: Diagnosis not present

## 2019-01-27 DIAGNOSIS — I359 Nonrheumatic aortic valve disorder, unspecified: Secondary | ICD-10-CM | POA: Diagnosis not present

## 2019-01-27 DIAGNOSIS — E7841 Elevated Lipoprotein(a): Secondary | ICD-10-CM | POA: Diagnosis not present

## 2019-04-25 ENCOUNTER — Encounter: Payer: Self-pay | Admitting: Obstetrics and Gynecology

## 2019-04-25 ENCOUNTER — Other Ambulatory Visit: Payer: Self-pay

## 2019-04-25 ENCOUNTER — Ambulatory Visit (INDEPENDENT_AMBULATORY_CARE_PROVIDER_SITE_OTHER): Payer: Medicare Other | Admitting: Obstetrics and Gynecology

## 2019-04-25 VITALS — BP 158/82 | HR 92 | Wt 140.9 lb

## 2019-04-25 DIAGNOSIS — N898 Other specified noninflammatory disorders of vagina: Secondary | ICD-10-CM

## 2019-04-25 DIAGNOSIS — T8389XA Other specified complication of genitourinary prosthetic devices, implants and grafts, initial encounter: Secondary | ICD-10-CM | POA: Diagnosis not present

## 2019-04-25 MED ORDER — TRIAMCINOLONE ACETONIDE 0.1 % EX CREA: TOPICAL_CREAM | CUTANEOUS | Status: AC

## 2019-04-25 NOTE — Progress Notes (Signed)
Patient comes in today for pessary cleaning. No concerns.

## 2019-04-25 NOTE — Progress Notes (Signed)
HPI:      [name] is a 85 y.o. No obstetric history on file. who LMP was No LMP recorded. Patient is postmenopausal.  Subjective:   She presents today for pessary care and cleaning.  She has no complaints.  She seems to have some decrease in mental acuity since we saw her last.  She is unable to understand some simple instructions.    Hx: The following portions of the patient's history were reviewed and updated as appropriate:             She  has a past medical history of Cervical polyp, Cystocele, Cystocele, Diabetes mellitus without complication ([place]), Hypertension, Insomnia, Procidentia of uterus, Rectocele, Uterine prolapse, Vaginal abrasion, Vaginal atrophy, and Vaginal erosion. She does not have any pertinent problems on file. She  has a past surgical history that includes Tubal ligation. Her family history is not on file. She  reports that she has never smoked. She has never used smokeless tobacco. She reports that she does not drink alcohol or use drugs. She has a current medication list which includes the following prescription(s): acyclovir, cvs chest congestion relief, cvs vitamin d3, enalapril, estradiol, hydrochlorothiazide, hydroxyzine, klor-con m20, loratadine, metformin, omeprazole, sertraline, and triamcinolone cream. She is allergic to penicillins.       Review of Systems:  Review of Systems  Constitutional: Denied constitutional symptoms, night sweats, recent illness, fatigue, fever, insomnia and weight loss.  Eyes: Denied eye symptoms, eye pain, photophobia, vision change and visual disturbance.  Ears/Nose/Throat/Neck: Denied ear, nose, throat or neck symptoms, hearing loss, nasal discharge, sinus congestion and sore throat.  Cardiovascular: Denied cardiovascular symptoms, arrhythmia, chest pain/pressure, edema, exercise intolerance, orthopnea and palpitations.  Respiratory: Denied pulmonary symptoms, asthma, pleuritic pain, productive sputum, cough, dyspnea  and wheezing.  Gastrointestinal: Denied, gastro-esophageal reflux, melena, nausea and vomiting.  Genitourinary: Denied genitourinary symptoms including symptomatic vaginal discharge, pelvic relaxation issues, and urinary complaints.  Musculoskeletal: Denied musculoskeletal symptoms, stiffness, swelling, muscle weakness and myalgia.  Dermatologic: Denied dermatology symptoms, rash and scar.  Neurologic: Denied neurology symptoms, dizziness, headache, neck pain and syncope.  Psychiatric: Denied psychiatric symptoms, anxiety and depression.  Endocrine: Denied endocrine symptoms including hot flashes and night sweats.   Meds:   Current Outpatient Medications on File Prior to Visit  Medication Sig Dispense Refill  . acyclovir (ZOVIRAX) 200 MG capsule TAKE 1 CAPSULE BY MOUTH THREE TIMES A DAY  1  . [facility] CHEST CONGESTION RELIEF 400 MG TABS tablet Take 400 mg by mouth 2 (two) times daily.  6  . [facility] VITAMIN D3 1000 UNITS capsule Take 1,000 Units by mouth daily.  5  . enalapril (VASOTEC) 2.5 MG tablet Take 2.5 mg by mouth daily.  3  . estradiol (ESTRACE VAGINAL) 0.1 MG/GM vaginal cream Apply twice a week. 42.5 g 1  . hydrochlorothiazide (HYDRODIURIL) 25 MG tablet Take 25 mg by mouth daily.  3  . hydrOXYzine (ATARAX/VISTARIL) 10 MG tablet Take 10 mg by mouth 3 (three) times daily as needed.    Marland Kitchen KLOR-CON M20 20 MEQ tablet Take 20 mEq by mouth 2 (two) times daily.  3  . loratadine (CLARITIN) 10 MG tablet Take 10 mg by mouth daily.  4  . metFORMIN (GLUCOPHAGE) 500 MG tablet Take 500 mg by mouth 2 (two) times daily.  3  . omeprazole (PRILOSEC) 20 MG capsule   3  . sertraline (ZOLOFT) 25 MG tablet Take 25 mg by mouth daily.  4  . triamcinolone cream (KENALOG)  0.1 % APPLY CREAM TO AFFECTED AREA TWO TIMES DAILY  3   No current facility-administered medications on file prior to visit.     Objective:     Vitals:   04/25/19 1029  BP: (!) 158/82  Pulse: 92              Pessary Care Pessary removed  and cleaned.  Vagina checked by speculum exam-posterior and right lateral vaginal erosions noted.  Small amount of bleeding.  Pessary not replaced.    Assessment:    No obstetric history on file. Patient Active Problem List   Diagnosis Date Noted  . Squamous cell carcinoma, arm 09/16/2015  . GERD (gastroesophageal reflux disease) 03/14/2015  . Chronic hypertension 03/14/2015  . Uterine prolapse 12/20/2014  . Cystocele, midline 12/20/2014  . Rectocele 12/20/2014     1. Vaginal erosion secondary to pessary use, initial encounter ([place])        Plan:            1.  Use of triamcinolone cream every other day.  Use of small amount of vaginal cream every other day.  2.  Reevaluate in 4 weeks for reinsertion of pessary. Discussed care in detail with patient's daughter as patient currently not understanding well instructions. Orders No orders of the defined types were placed in this encounter.   No orders of the defined types were placed in this encounter.     F/U  Return in about 4 weeks (around 05/23/2019). I spent 19 minutes involved in the care of this patient of which greater than 50% was spent discussing pessary care, vaginal erosions, future pessary use, vaginal medication use.  Finis Bud, M.D. 04/25/2019 11:05 AM

## 2019-04-26 ENCOUNTER — Other Ambulatory Visit: Payer: Self-pay | Admitting: Obstetrics and Gynecology

## 2019-04-26 ENCOUNTER — Telehealth: Payer: Self-pay | Admitting: Obstetrics and Gynecology

## 2019-04-26 MED ORDER — TRIAMCINOLONE ACETONIDE 0.1 % EX OINT: TOPICAL_OINTMENT | CUTANEOUS | 1 refills | Status: DC

## 2019-04-26 MED ORDER — PREMARIN 0.625 MG/GM VA CREA: TOPICAL_CREAM | VAGINAL | 12 refills | Status: DC

## 2019-04-26 NOTE — Addendum Note (Signed)
Addended by: Durwin Glaze on: 04/26/2019 03:45 PM   Modules accepted: Orders

## 2019-04-26 NOTE — Telephone Encounter (Addendum)
LM on patients answering machine to return call. I also left message on daughters cell phone that I have resent medication into the pharmacy and we did not have samples yesterday to give patient.

## 2019-04-26 NOTE — Telephone Encounter (Signed)
The patients daughter called and stated that the patient did not receive any samples yesterday after her apt of premarin cream and the pts medication (Premarin) has not been sent to her pharmacy [facility] [place]) Pt requesting a call back. Please advise.

## 2019-04-26 NOTE — Telephone Encounter (Signed)
Premarin prescription sent to the pharmacy.

## 2019-05-23 ENCOUNTER — Encounter: Payer: Self-pay | Admitting: Obstetrics and Gynecology

## 2019-05-23 ENCOUNTER — Ambulatory Visit (INDEPENDENT_AMBULATORY_CARE_PROVIDER_SITE_OTHER): Payer: Medicare Other | Admitting: Obstetrics and Gynecology

## 2019-05-23 ENCOUNTER — Other Ambulatory Visit: Payer: Self-pay

## 2019-05-23 VITALS — BP 109/69 | HR 108 | Wt 136.2 lb

## 2019-05-23 DIAGNOSIS — Z4689 Encounter for fitting and adjustment of other specified devices: Secondary | ICD-10-CM

## 2019-05-23 DIAGNOSIS — N898 Other specified noninflammatory disorders of vagina: Secondary | ICD-10-CM | POA: Diagnosis not present

## 2019-05-23 DIAGNOSIS — T8389XA Other specified complication of genitourinary prosthetic devices, implants and grafts, initial encounter: Secondary | ICD-10-CM

## 2019-05-23 NOTE — Progress Notes (Signed)
HPI:      [name] is a 85 y.o. No obstetric history on file. who LMP was No LMP recorded. Patient is postmenopausal.  Subjective:   She presents today for reevaluation of vaginal erosion secondary to pessary use.  Patient has left the pessary out for 1 month.  She states that she has been using the vaginal cream as directed.  She also states that she has been somewhat limited in her activities because of the prolapse without the pessary.  She is happy that it is time to replace it.    Hx: The following portions of the patient's history were reviewed and updated as appropriate:             She  has a past medical history of Cervical polyp, Cystocele, Cystocele, Diabetes mellitus without complication ([place]), Hypertension, Insomnia, Procidentia of uterus, Rectocele, Uterine prolapse, Vaginal abrasion, Vaginal atrophy, and Vaginal erosion. She does not have any pertinent problems on file. She  has a past surgical history that includes Tubal ligation. Her family history is not on file. She  reports that she has never smoked. She has never used smokeless tobacco. She reports that she does not drink alcohol or use drugs. She has a current medication list which includes the following prescription(s): acyclovir, cvs chest congestion relief, cvs vitamin d3, enalapril, estradiol, estradiol, hydrochlorothiazide, hydroxyzine, klor-con m20, loratadine, metformin, omeprazole, sertraline, triamcinolone cream, and triamcinolone ointment, and the following Facility-Administered Medications: triamcinolone cream. She is allergic to penicillins.       Review of Systems:  Review of Systems  Constitutional: Denied constitutional symptoms, night sweats, recent illness, fatigue, fever, insomnia and weight loss.  Eyes: Denied eye symptoms, eye pain, photophobia, vision change and visual disturbance.  Ears/Nose/Throat/Neck: Denied ear, nose, throat or neck symptoms, hearing loss, nasal discharge, sinus  congestion and sore throat.  Cardiovascular: Denied cardiovascular symptoms, arrhythmia, chest pain/pressure, edema, exercise intolerance, orthopnea and palpitations.  Respiratory: Denied pulmonary symptoms, asthma, pleuritic pain, productive sputum, cough, dyspnea and wheezing.  Gastrointestinal: Denied, gastro-esophageal reflux, melena, nausea and vomiting.  Genitourinary: Denied genitourinary symptoms including symptomatic vaginal discharge, pelvic relaxation issues, and urinary complaints.  Musculoskeletal: Denied musculoskeletal symptoms, stiffness, swelling, muscle weakness and myalgia.  Dermatologic: Denied dermatology symptoms, rash and scar.  Neurologic: Denied neurology symptoms, dizziness, headache, neck pain and syncope.  Psychiatric: Denied psychiatric symptoms, anxiety and depression.  Endocrine: Denied endocrine symptoms including hot flashes and night sweats.   Meds:   Current Outpatient Medications on File Prior to Visit  Medication Sig Dispense Refill  . acyclovir (ZOVIRAX) 200 MG capsule TAKE 1 CAPSULE BY MOUTH THREE TIMES A DAY  1  . [facility] CHEST CONGESTION RELIEF 400 MG TABS tablet Take 400 mg by mouth 2 (two) times daily.  6  . [facility] VITAMIN D3 1000 UNITS capsule Take 1,000 Units by mouth daily.  5  . enalapril (VASOTEC) 2.5 MG tablet Take 2.5 mg by mouth daily.  3  . estradiol (ESTRACE VAGINAL) 0.1 MG/GM vaginal cream Apply twice a week. 42.5 g 1  . estradiol (ESTRACE) 0.1 MG/GM vaginal cream Apply twice weekly. 42.5 g 2  . hydrochlorothiazide (HYDRODIURIL) 25 MG tablet Take 25 mg by mouth daily.  3  . hydrOXYzine (ATARAX/VISTARIL) 10 MG tablet Take 10 mg by mouth 3 (three) times daily as needed.    Marland Kitchen KLOR-CON M20 20 MEQ tablet Take 20 mEq by mouth 2 (two) times daily.  3  . loratadine (CLARITIN) 10 MG tablet Take 10 mg  by mouth daily.  4  . metFORMIN (GLUCOPHAGE) 500 MG tablet Take 500 mg by mouth 2 (two) times daily.  3  . omeprazole (PRILOSEC) 20 MG capsule   3  .  sertraline (ZOLOFT) 25 MG tablet Take 25 mg by mouth daily.  4  . triamcinolone cream (KENALOG) 0.1 % APPLY CREAM TO AFFECTED AREA TWO TIMES DAILY  3  . triamcinolone ointment (KENALOG) 0.1 % Topical, Every other day, First dose on Tue 04/25/19 at 1115, For 30 days Apply into vagina as directed 30 g 1   Current Facility-Administered Medications on File Prior to Visit  Medication Dose Route Frequency Provider Last Rate Last Dose  . triamcinolone cream (KENALOG) 0.1 %   Topical Theodoro Parma, MD        Objective:     Vitals:   05/23/19 1359  BP: 109/69  Pulse: (!) 108              Speculum examination reveals no further vaginal erosions.  Vaginal mucosa looks healthy.   Pessary replaced.  Assessment:    No obstetric history on file. Patient Active Problem List   Diagnosis Date Noted  . Squamous cell carcinoma, arm 09/16/2015  . GERD (gastroesophageal reflux disease) 03/14/2015  . Chronic hypertension 03/14/2015  . Uterine prolapse 12/20/2014  . Cystocele, midline 12/20/2014  . Rectocele 12/20/2014     1. Pessary maintenance   2. Vaginal erosion secondary to pessary use, initial encounter [facility])     Vaginal erosion resolved.   Plan:            1.  Patient to return in 3 months for removal and cleaning of pessary and to recheck for vaginal erosions.  Triamcinolone cream as directed. Orders No orders of the defined types were placed in this encounter.   No orders of the defined types were placed in this encounter.     F/U  Return in about 3 months (around 08/23/2019). I spent 16 minutes involved in the care of this patient of which greater than 50% was spent discussing continued issues with vaginal prolapse, pessary care and maintenance, vaginal erosion and healing.  All questions answered.  Finis Bud, M.D. 05/23/2019 2:20 PM

## 2019-06-06 DIAGNOSIS — L821 Other seborrheic keratosis: Secondary | ICD-10-CM | POA: Diagnosis not present

## 2019-06-06 DIAGNOSIS — Z23 Encounter for immunization: Secondary | ICD-10-CM | POA: Diagnosis not present

## 2019-06-06 DIAGNOSIS — E119 Type 2 diabetes mellitus without complications: Secondary | ICD-10-CM | POA: Diagnosis not present

## 2019-06-06 DIAGNOSIS — I359 Nonrheumatic aortic valve disorder, unspecified: Secondary | ICD-10-CM | POA: Diagnosis not present

## 2019-06-06 DIAGNOSIS — I10 Essential (primary) hypertension: Secondary | ICD-10-CM | POA: Diagnosis not present

## 2019-08-23 ENCOUNTER — Encounter: Payer: Self-pay | Admitting: Obstetrics and Gynecology

## 2019-08-23 ENCOUNTER — Other Ambulatory Visit: Payer: Self-pay

## 2019-08-23 ENCOUNTER — Ambulatory Visit (INDEPENDENT_AMBULATORY_CARE_PROVIDER_SITE_OTHER): Payer: Medicare Other | Admitting: Obstetrics and Gynecology

## 2019-08-23 VITALS — BP 151/73 | HR 95 | Ht 60.0 in | Wt 132.3 lb

## 2019-08-23 DIAGNOSIS — Z4689 Encounter for fitting and adjustment of other specified devices: Secondary | ICD-10-CM | POA: Diagnosis not present

## 2019-08-23 NOTE — Progress Notes (Signed)
HPI:      [name] is a 86 y.o. No obstetric history on file. who LMP was No LMP recorded. Patient is postmenopausal.  Subjective:   She presents today for pessary care maintenance.  She states that she continues to use vaginal cream as directed every other day.  She is much happier with the pessary in and says it is very difficult because of her prolapse when it is not in place.                      Review of Systems:  Review of Systems  Constitutional: Denied constitutional symptoms, night sweats, recent illness, fatigue, fever, insomnia and weight loss.  Eyes: Denied eye symptoms, eye pain, photophobia, vision change and visual disturbance.  Ears/Nose/Throat/Neck: Denied ear, nose, throat or neck symptoms, hearing loss, nasal discharge, sinus congestion and sore throat.  Cardiovascular: Denied cardiovascular symptoms, arrhythmia, chest pain/pressure, edema, exercise intolerance, orthopnea and palpitations.  Respiratory: Denied pulmonary symptoms, asthma, pleuritic pain, productive sputum, cough, dyspnea and wheezing.  Gastrointestinal: Denied, gastro-esophageal reflux, melena, nausea and vomiting.  Genitourinary: Denied genitourinary symptoms including symptomatic vaginal discharge, pelvic relaxation issues, and urinary complaints.  Musculoskeletal: Denied musculoskeletal symptoms, stiffness, swelling, muscle weakness and myalgia.  Dermatologic: Denied dermatology symptoms, rash and scar.  Neurologic: Denied neurology symptoms, dizziness, headache, neck pain and syncope.  Psychiatric: Denied psychiatric symptoms, anxiety and depression.  Endocrine: Denied endocrine symptoms including hot flashes and night sweats.   Meds:   Current Outpatient Medications on File Prior to Visit  Medication Sig Dispense Refill  . acyclovir (ZOVIRAX) 200 MG capsule TAKE 1 CAPSULE BY MOUTH THREE TIMES A DAY  1  . [facility]S CHEST CONGESTION RELIEF 400 MG TABS tablet Take 400 mg by mouth 2 (two) times  daily.  6  . [facility]S VITAMIN D3 1000 UNITS capsule Take 1,000 Units by mouth daily.  5  . enalapril (VASOTEC) 2.5 MG tablet Take 2.5 mg by mouth daily.  3  . estradiol (ESTRACE VAGINAL) 0.1 MG/GM vaginal cream Apply twice a week. 42.5 g 1  . estradiol (ESTRACE) 0.1 MG/GM vaginal cream Apply twice weekly. 42.5 g 2  . hydrochlorothiazide (HYDRODIURIL) 25 MG tablet Take 25 mg by mouth daily.  3  . hydrOXYzine (ATARAX/VISTARIL) 10 MG tablet Take 10 mg by mouth 3 (three) times daily as needed.    Marland Kitchen KLOR-CON M20 20 MEQ tablet Take 20 mEq by mouth 2 (two) times daily.  3  . loratadine (CLARITIN) 10 MG tablet Take 10 mg by mouth daily.  4  . metFORMIN (GLUCOPHAGE) 500 MG tablet Take 500 mg by mouth 2 (two) times daily.  3  . omeprazole (PRILOSEC) 20 MG capsule   3  . sertraline (ZOLOFT) 25 MG tablet Take 25 mg by mouth daily.  4  . triamcinolone cream (KENALOG) 0.1 % APPLY CREAM TO AFFECTED AREA TWO TIMES DAILY  3  . triamcinolone ointment (KENALOG) 0.1 % Topical, Every other day, First dose on Tue 04/25/19 at 1115, For 30 days Apply into vagina as directed 30 g 1   No current facility-administered medications on file prior to visit.    Objective:     Vitals:   08/23/19 1423  BP: (!) 151/73  Pulse: 95              Pessary Care Pessary removed and cleaned.  Vagina checked by speculum exam- without erosions - pessary replaced.  Difficult pessary removal caused some vaginal bleeding.  Assessment:    No obstetric history on file. Patient Active Problem List   Diagnosis Date Noted  . Squamous cell carcinoma, arm 09/16/2015  . GERD (gastroesophageal reflux disease) 03/14/2015  . Chronic hypertension 03/14/2015  . Uterine prolapse 12/20/2014  . Cystocele, midline 12/20/2014  . Rectocele 12/20/2014     1. Pessary maintenance     Expect vaginal bleeding 1 to 2 days-no lesions noted   Plan:            1.  Follow-up in 3 months for pessary care Orders No orders of the defined types  were placed in this encounter.   No orders of the defined types were placed in this encounter.     F/U  Return in about 3 months (around 11/20/2019).  Finis Bud, M.D. 08/23/2019 3:02 PM

## 2019-08-23 NOTE — Progress Notes (Signed)
Patient comes in today for pessary check. She has been having some bleeding.

## 2019-09-04 DIAGNOSIS — I10 Essential (primary) hypertension: Secondary | ICD-10-CM | POA: Diagnosis not present

## 2019-09-04 DIAGNOSIS — L821 Other seborrheic keratosis: Secondary | ICD-10-CM | POA: Diagnosis not present

## 2019-09-04 DIAGNOSIS — N814 Uterovaginal prolapse, unspecified: Secondary | ICD-10-CM | POA: Diagnosis not present

## 2019-09-04 DIAGNOSIS — L659 Nonscarring hair loss, unspecified: Secondary | ICD-10-CM | POA: Diagnosis not present

## 2019-11-05 ENCOUNTER — Encounter: Payer: Self-pay | Admitting: Internal Medicine

## 2019-11-16 ENCOUNTER — Other Ambulatory Visit: Payer: Self-pay | Admitting: Obstetrics and Gynecology

## 2019-11-21 ENCOUNTER — Encounter: Payer: Self-pay | Admitting: Obstetrics and Gynecology

## 2019-11-21 ENCOUNTER — Other Ambulatory Visit: Payer: Self-pay

## 2019-11-21 ENCOUNTER — Ambulatory Visit (INDEPENDENT_AMBULATORY_CARE_PROVIDER_SITE_OTHER): Payer: Medicare Other | Admitting: Obstetrics and Gynecology

## 2019-11-21 VITALS — BP 108/72 | HR 80 | Ht 60.0 in | Wt 130.1 lb

## 2019-11-21 DIAGNOSIS — N898 Other specified noninflammatory disorders of vagina: Secondary | ICD-10-CM

## 2019-11-21 DIAGNOSIS — Z4689 Encounter for fitting and adjustment of other specified devices: Secondary | ICD-10-CM | POA: Diagnosis not present

## 2019-11-21 NOTE — Progress Notes (Signed)
HPI:      [name] is a 86 y.o. No obstetric history on file. who LMP was No LMP recorded. Patient is postmenopausal.  Subjective:   She presents today for pessary maintenance.  Patient states that she has been using Premarin cream 2 times per week.  She does report that she occasionally has vaginal bleeding. She is a poor historian and does not often answer questions appropriately nor voicing understanding of medical explanations.    Hx: The following portions of the patient's history were reviewed and updated as appropriate:             She  has a past medical history of Cervical polyp, Cystocele, Cystocele, Diabetes mellitus without complication ([place]), Hypertension, Insomnia, Procidentia of uterus, Rectocele, Uterine prolapse, Vaginal abrasion, Vaginal atrophy, and Vaginal erosion. She does not have any pertinent problems on file. She  has a past surgical history that includes Tubal ligation. Her family history is not on file. She  reports that she has never smoked. She has never used smokeless tobacco. She reports that she does not drink alcohol or use drugs. She has a current medication list which includes the following prescription(s): acyclovir, cvs chest congestion relief, cvs vitamin d3, enalapril, estradiol, estradiol, hydrochlorothiazide, hydroxyzine, klor-con m20, loratadine, metformin, omeprazole, sertraline, triamcinolone cream, and triamcinolone ointment. She is allergic to penicillins.       Review of Systems:  Review of Systems  Constitutional: Denied constitutional symptoms, night sweats, recent illness, fatigue, fever, insomnia and weight loss.  Eyes: Denied eye symptoms, eye pain, photophobia, vision change and visual disturbance.  Ears/Nose/Throat/Neck: Denied ear, nose, throat or neck symptoms, hearing loss, nasal discharge, sinus congestion and sore throat.  Cardiovascular: Denied cardiovascular symptoms, arrhythmia, chest pain/pressure, edema, exercise  intolerance, orthopnea and palpitations.  Respiratory: Denied pulmonary symptoms, asthma, pleuritic pain, productive sputum, cough, dyspnea and wheezing.  Gastrointestinal: Denied, gastro-esophageal reflux, melena, nausea and vomiting.  Genitourinary: Denied genitourinary symptoms including symptomatic vaginal discharge, pelvic relaxation issues, and urinary complaints.  Musculoskeletal: Denied musculoskeletal symptoms, stiffness, swelling, muscle weakness and myalgia.  Dermatologic: Denied dermatology symptoms, rash and scar.  Neurologic: Denied neurology symptoms, dizziness, headache, neck pain and syncope.  Psychiatric: Denied psychiatric symptoms, anxiety and depression.  Endocrine: Denied endocrine symptoms including hot flashes and night sweats.   Meds:   Current Outpatient Medications on File Prior to Visit  Medication Sig Dispense Refill  . acyclovir (ZOVIRAX) 200 MG capsule TAKE 1 CAPSULE BY MOUTH THREE TIMES A DAY  1  . CVS CHEST CONGESTION RELIEF 400 MG TABS tablet Take 400 mg by mouth 2 (two) times daily.  6  . [facility]S VITAMIN D3 1000 UNITS capsule Take 1,000 Units by mouth daily.  5  . enalapril (VASOTEC) 2.5 MG tablet Take 2.5 mg by mouth daily.  3  . estradiol (ESTRACE VAGINAL) 0.1 MG/GM vaginal cream Apply twice a week. 42.5 g 1  . estradiol (ESTRACE) 0.1 MG/GM vaginal cream Apply twice weekly. 42.5 g 2  . hydrochlorothiazide (HYDRODIURIL) 25 MG tablet Take 25 mg by mouth daily.  3  . hydrOXYzine (ATARAX/VISTARIL) 10 MG tablet Take 10 mg by mouth 3 (three) times daily as needed.    Marland Kitchen KLOR-CON M20 20 MEQ tablet Take 20 mEq by mouth 2 (two) times daily.  3  . loratadine (CLARITIN) 10 MG tablet Take 10 mg by mouth daily.  4  . metFORMIN (GLUCOPHAGE) 500 MG tablet Take 500 mg by mouth 2 (two) times daily.  3  .  omeprazole (PRILOSEC) 20 MG capsule   3  . sertraline (ZOLOFT) 25 MG tablet Take 25 mg by mouth daily.  4  . triamcinolone cream (KENALOG) 0.1 % APPLY CREAM TO AFFECTED AREA  TWO TIMES DAILY  3  . triamcinolone ointment (KENALOG) 0.1 % APPLY INTO VAGINA AS DIRECTED EVERY OTHER DAY, FIRST DOSE ON 10/6 AT 11:15 AM FOR 30 DAYS 30 g 0   No current facility-administered medications on file prior to visit.    Objective:     Vitals:   11/21/19 1341  BP: 108/72  Pulse: 80              Pessary Care Pessary removed and cleaned.  Vagina checked by speculum exam-multiple superficial erosions noted- pessary not replaced.    Assessment:    No obstetric history on file. Patient Active Problem List   Diagnosis Date Noted  . Squamous cell carcinoma, arm 09/16/2015  . GERD (gastroesophageal reflux disease) 03/14/2015  . Chronic hypertension 03/14/2015  . Uterine prolapse 12/20/2014  . Cystocele, midline 12/20/2014  . Rectocele 12/20/2014     1. Vaginal erosion secondary to pessary use, initial encounter ([place])   2. Pessary maintenance     Patient having some vaginal bleeding from pessary erosion.   Plan:            1.  Recommend leaving pessary out.  2.  Use Premarin cream every other day one third applicator sample given  3.  Reexamine in 6 weeks consider pessary replacement versus getting a whole new pessary possibly of smaller size.  Have discussed all the above in detail with the patient's daughter because she could not seem to comprehend the current medical situation. Orders No orders of the defined types were placed in this encounter.   No orders of the defined types were placed in this encounter.     F/U  Return in about 6 weeks (around 01/02/2020). I spent 22 minutes involved in the care of this patient preparing to see the patient by obtaining and reviewing her medical history (including labs, imaging tests and prior procedures), documenting clinical information in the electronic health record (EHR), counseling and coordinating care plans, writing and sending prescriptions, ordering tests or procedures and directly communicating with the patient  by discussing pertinent items from her history and physical exam as well as detailing my assessment and plan as noted above so that she has an informed understanding.  All of her questions were answered.  Finis Bud, M.D. 11/21/2019 2:09 PM

## 2019-11-30 ENCOUNTER — Ambulatory Visit: Payer: Self-pay | Admitting: Internal Medicine

## 2019-12-01 ENCOUNTER — Ambulatory Visit (INDEPENDENT_AMBULATORY_CARE_PROVIDER_SITE_OTHER): Payer: Medicare Other | Admitting: Internal Medicine

## 2019-12-01 ENCOUNTER — Encounter: Payer: Self-pay | Admitting: Internal Medicine

## 2019-12-01 ENCOUNTER — Other Ambulatory Visit: Payer: Self-pay

## 2019-12-01 VITALS — BP 133/59 | HR 87 | Wt 130.7 lb

## 2019-12-01 DIAGNOSIS — K219 Gastro-esophageal reflux disease without esophagitis: Secondary | ICD-10-CM

## 2019-12-01 DIAGNOSIS — I10 Essential (primary) hypertension: Secondary | ICD-10-CM | POA: Diagnosis not present

## 2019-12-01 DIAGNOSIS — N8111 Cystocele, midline: Secondary | ICD-10-CM

## 2019-12-01 DIAGNOSIS — N816 Rectocele: Secondary | ICD-10-CM | POA: Diagnosis not present

## 2019-12-01 NOTE — Assessment & Plan Note (Signed)
Follow-up with GU and gynecologist

## 2019-12-01 NOTE — Assessment & Plan Note (Signed)
Stable

## 2019-12-01 NOTE — Progress Notes (Signed)
Established Patient Office Visit  Subjective:  Patient ID: Tonya Atkinson, female    DOB: Jul 16, 1928  Age: 86 y.o. MRN: [id number]  CC: No chief complaint on file.   HPI  Tonya Atkinson presents for office check.  She is known to have a prolapse of the bladder and Dr. Ellard Artis the gynecologist is trying to find out right kind of pessary to keep the bladder stable and prevent it from getting infected.  She wants to find out whether she should get a Covid shot not I told her that she should try to get it as soon as possible.  She did not have any major complaint except for occasional insomnia.  Her blood pressure is under control and mental status has remained unchanged recently she has gone to the beach and she enjoyed a trip to the beach.  Past Medical History:  Diagnosis Date  . Cervical polyp   . Cystocele   . Cystocele   . Diabetes mellitus without complication ([place])   . Hypertension   . Insomnia   . Procidentia of uterus   . Rectocele   . Uterine prolapse   . Vaginal abrasion   . Vaginal atrophy   . Vaginal erosion    secondary to pessary use    Past Surgical History:  Procedure Laterality Date  . TUBAL LIGATION      Family History  Problem Relation Age of Onset  . Cancer Neg Hx     Social History   Socioeconomic History  . Marital status: Widowed    Spouse name: Not on file  . Number of children: Not on file  . Years of education: Not on file  . Highest education level: Not on file  Occupational History  . Not on file  Tobacco Use  . Smoking status: Never Smoker  . Smokeless tobacco: Never Used  Substance and Sexual Activity  . Alcohol use: No    Alcohol/week: 0.0 standard drinks  . Drug use: No  . Sexual activity: Not Currently  Other Topics Concern  . Not on file  Social History Narrative  . Not on file   Social Determinants of Health   Financial Resource Strain:   . Difficulty of Paying Living Expenses:   Food Insecurity:   . Worried About  Charity fundraiser in the Last Year:   . Arboriculturist in the Last Year:   Transportation Needs:   . Film/video editor (Medical):   Marland Kitchen Lack of Transportation (Non-Medical):   Physical Activity:   . Days of Exercise per Week:   . Minutes of Exercise per Session:   Stress:   . Feeling of Stress :   Social Connections:   . Frequency of Communication with Friends and Family:   . Frequency of Social Gatherings with Friends and Family:   . Attends Religious Services:   . Active Member of Clubs or Organizations:   . Attends Archivist Meetings:   Marland Kitchen Marital Status:   Intimate Partner Violence:   . Fear of Current or Ex-Partner:   . Emotionally Abused:   Marland Kitchen Physically Abused:   . Sexually Abused:      Current Outpatient Medications:  .  aspirin EC 81 MG tablet, Take 81 mg by mouth daily., Disp: , Rfl:  .  Fexofenadine HCl (MUCINEX ALLERGY PO), Take by mouth as needed., Disp: , Rfl:  .  [employer] VITAMIN D3 1000 UNITS capsule, Take 1,000 Units by mouth daily.,  Disp: , Rfl: 5 .  enalapril (VASOTEC) 2.5 MG tablet, Take 2.5 mg by mouth daily., Disp: , Rfl: 3 .  estradiol (ESTRACE VAGINAL) 0.1 MG/GM vaginal cream, Apply twice a week., Disp: 42.5 g, Rfl: 1 .  hydrochlorothiazide (HYDRODIURIL) 25 MG tablet, Take 25 mg by mouth daily., Disp: , Rfl: 3 .  hydrOXYzine (ATARAX/VISTARIL) 10 MG tablet, Take 10 mg by mouth 3 (three) times daily as needed., Disp: , Rfl:  .  KLOR-CON M20 20 MEQ tablet, Take 20 mEq by mouth 2 (two) times daily., Disp: , Rfl: 3 .  loratadine (CLARITIN) 10 MG tablet, Take 10 mg by mouth daily., Disp: , Rfl: 4 .  metFORMIN (GLUCOPHAGE) 500 MG tablet, Take 500 mg by mouth 2 (two) times daily., Disp: , Rfl: 3 .  omeprazole (PRILOSEC) 20 MG capsule, , Disp: , Rfl: 3 .  sertraline (ZOLOFT) 25 MG tablet, Take 25 mg by mouth daily., Disp: , Rfl: 4 .  triamcinolone ointment (KENALOG) 0.1 %, APPLY INTO VAGINA AS DIRECTED EVERY OTHER DAY, FIRST DOSE ON 10/6 AT 11:15 AM FOR  30 DAYS, Disp: 30 g, Rfl: 0   Allergies  Allergen Reactions  . Penicillins     ROS Review of Systems  Constitutional: Negative for fatigue.  HENT: Negative for mouth sores.   Eyes: Negative for itching.  Respiratory: Positive for choking.   Cardiovascular: Negative for leg swelling.  Gastrointestinal: Negative for abdominal pain.  Genitourinary: Positive for frequency.  Musculoskeletal: Positive for gait problem. Negative for back pain.  Psychiatric/Behavioral: Negative for agitation and hallucinations.      Objective:    Physical Exam  Constitutional: She is oriented to person, place, and time.  HENT:  Head: Normocephalic and atraumatic.  Eyes: Pupils are equal, round, and reactive to light. No scleral icterus.  Neck: No JVD present. No thyromegaly present.  Cardiovascular:  No murmur heard. Pulmonary/Chest: She has no wheezes.  Abdominal: She exhibits no mass. There is abdominal tenderness.  Musculoskeletal:        General: No edema.     Cervical back: Neck supple.  Lymphadenopathy:    She has cervical adenopathy.  Neurological: She is alert and oriented to person, place, and time.  Skin: No erythema.    BP (!) 133/59 (BP Location: Left Arm, Patient Position: Sitting, Cuff Size: Normal)   Pulse 87   Wt 130 lb 11.2 oz (59.3 kg)   BMI 25.53 kg/m  Wt Readings from Last 3 Encounters:  12/01/19 130 lb 11.2 oz (59.3 kg)  11/21/19 130 lb 1.6 oz (59 kg)  09/04/19 136 lb (61.7 kg)     Health Maintenance Due  Topic Date Due  . COVID-19 Vaccine (1) Never done  . TETANUS/TDAP  Never done  . DEXA SCAN  Never done  . PNA vac Low Risk Adult (1 of 2 - PCV13) Never done    There are no preventive care reminders to display for this patient.  No results found for: TSH No results found for: WBC, HGB, HCT, MCV, PLT No results found for: NA, K, CHLORIDE, CO2, GLUCOSE, BUN, CREATININE, BILITOT, ALKPHOS, AST, ALT, PROT, ALBUMIN, CALCIUM, ANIONGAP, EGFR, GFR No results  found for: CHOL No results found for: HDL No results found for: LDLCALC No results found for: TRIG No results found for: CHOLHDL No results found for: HGBA1C    Assessment & Plan:   Problem List Items Addressed This Visit      Cardiovascular and Mediastinum   Chronic hypertension -  Primary    Stable      Relevant Medications   aspirin EC 81 MG tablet     Digestive   Rectocele    Follow-up with GYN      GERD (gastroesophageal reflux disease)    Do not eat 2 hours before she going to bed        Genitourinary   Cystocele, midline    Follow-up with GU and gynecologist        Patient has been doing well she has  unsteady gait and uses a stick to walk.  She has a frequency of micturition and has been seeing a gynecologist for that she has a prolapse of the bladder and is taking care of it.  Occasionally she complains of choking  choking is due to mucus and I told her to take Robitussin cough syrup 1 teaspoonful 3 times a day as needed.  She will return back in 3 months or earlier for any problems No orders of the defined types were placed in this encounter.  . Follow-up: Return in about 3 months (around 03/02/2020).    Cletis Athens, MD

## 2019-12-01 NOTE — Assessment & Plan Note (Signed)
Follow-up with GYN

## 2019-12-01 NOTE — Assessment & Plan Note (Signed)
Do not eat 2 hours before she going to bed

## 2019-12-07 ENCOUNTER — Other Ambulatory Visit: Payer: Self-pay

## 2019-12-07 MED ORDER — METFORMIN HCL 500 MG PO TABS: 500.0000 mg | ORAL_TABLET | Freq: Two times a day (BID) | ORAL | 3 refills | Status: AC

## 2019-12-14 ENCOUNTER — Other Ambulatory Visit: Payer: Self-pay | Admitting: Internal Medicine

## 2019-12-15 ENCOUNTER — Other Ambulatory Visit: Payer: Self-pay | Admitting: *Deleted

## 2019-12-15 MED ORDER — ENALAPRIL MALEATE 2.5 MG PO TABS: 2.5000 mg | ORAL_TABLET | Freq: Every day | ORAL | 6 refills | Status: AC

## 2019-12-21 ENCOUNTER — Inpatient Hospital Stay: Payer: Medicare Other

## 2019-12-21 ENCOUNTER — Other Ambulatory Visit: Payer: Self-pay

## 2019-12-21 ENCOUNTER — Telehealth: Payer: Self-pay | Admitting: Obstetrics and Gynecology

## 2019-12-21 ENCOUNTER — Emergency Department: Payer: Medicare Other

## 2019-12-21 ENCOUNTER — Inpatient Hospital Stay
Admission: EM | Admit: 2019-12-21 | Discharge: 2019-12-21 | DRG: 871 | Disposition: A | Discharge status: Home / Self Care | Payer: Medicare Other | Attending: Pulmonary Disease | Admitting: Pulmonary Disease

## 2019-12-21 DIAGNOSIS — Z9851 Tubal ligation status: Secondary | ICD-10-CM | POA: Diagnosis not present

## 2019-12-21 DIAGNOSIS — R5381 Other malaise: Secondary | ICD-10-CM | POA: Diagnosis not present

## 2019-12-21 DIAGNOSIS — M2550 Pain in unspecified joint: Secondary | ICD-10-CM | POA: Diagnosis not present

## 2019-12-21 DIAGNOSIS — Z515 Encounter for palliative care: Secondary | ICD-10-CM | POA: Diagnosis not present

## 2019-12-21 DIAGNOSIS — Z7189 Other specified counseling: Secondary | ICD-10-CM | POA: Diagnosis not present

## 2019-12-21 DIAGNOSIS — R52 Pain, unspecified: Secondary | ICD-10-CM | POA: Diagnosis not present

## 2019-12-21 DIAGNOSIS — R000 Tachycardia, unspecified: Secondary | ICD-10-CM | POA: Diagnosis present

## 2019-12-21 DIAGNOSIS — Z7401 Bed confinement status: Secondary | ICD-10-CM | POA: Diagnosis not present

## 2019-12-21 DIAGNOSIS — N179 Acute kidney failure, unspecified: Secondary | ICD-10-CM | POA: Diagnosis present

## 2019-12-21 DIAGNOSIS — R05 Cough: Secondary | ICD-10-CM | POA: Diagnosis not present

## 2019-12-21 DIAGNOSIS — Z20822 Contact with and (suspected) exposure to covid-19: Secondary | ICD-10-CM | POA: Diagnosis present

## 2019-12-21 DIAGNOSIS — R6521 Severe sepsis with septic shock: Secondary | ICD-10-CM | POA: Diagnosis present

## 2019-12-21 DIAGNOSIS — I10 Essential (primary) hypertension: Secondary | ICD-10-CM | POA: Diagnosis present

## 2019-12-21 DIAGNOSIS — Z66 Do not resuscitate: Secondary | ICD-10-CM

## 2019-12-21 DIAGNOSIS — G4700 Insomnia, unspecified: Secondary | ICD-10-CM | POA: Diagnosis present

## 2019-12-21 DIAGNOSIS — E872 Acidosis: Secondary | ICD-10-CM | POA: Diagnosis present

## 2019-12-21 DIAGNOSIS — Z7982 Long term (current) use of aspirin: Secondary | ICD-10-CM

## 2019-12-21 DIAGNOSIS — J9601 Acute respiratory failure with hypoxia: Secondary | ICD-10-CM | POA: Diagnosis not present

## 2019-12-21 DIAGNOSIS — J90 Pleural effusion, not elsewhere classified: Secondary | ICD-10-CM | POA: Diagnosis present

## 2019-12-21 DIAGNOSIS — A419 Sepsis, unspecified organism: Secondary | ICD-10-CM | POA: Diagnosis not present

## 2019-12-21 DIAGNOSIS — Z79899 Other long term (current) drug therapy: Secondary | ICD-10-CM

## 2019-12-21 DIAGNOSIS — Z7984 Long term (current) use of oral hypoglycemic drugs: Secondary | ICD-10-CM

## 2019-12-21 DIAGNOSIS — R0902 Hypoxemia: Secondary | ICD-10-CM | POA: Diagnosis not present

## 2019-12-21 DIAGNOSIS — R4182 Altered mental status, unspecified: Secondary | ICD-10-CM | POA: Diagnosis not present

## 2019-12-21 DIAGNOSIS — N329 Bladder disorder, unspecified: Secondary | ICD-10-CM | POA: Diagnosis present

## 2019-12-21 DIAGNOSIS — D649 Anemia, unspecified: Secondary | ICD-10-CM | POA: Diagnosis present

## 2019-12-21 DIAGNOSIS — Z880 Allergy status to penicillin: Secondary | ICD-10-CM | POA: Diagnosis not present

## 2019-12-21 DIAGNOSIS — E119 Type 2 diabetes mellitus without complications: Secondary | ICD-10-CM | POA: Diagnosis present

## 2019-12-21 DIAGNOSIS — E860 Dehydration: Secondary | ICD-10-CM | POA: Diagnosis not present

## 2019-12-21 DIAGNOSIS — R404 Transient alteration of awareness: Secondary | ICD-10-CM | POA: Diagnosis not present

## 2019-12-21 DIAGNOSIS — R509 Fever, unspecified: Secondary | ICD-10-CM | POA: Diagnosis not present

## 2019-12-21 LAB — BLOOD CULTURE ID PANEL (REFLEXED)

## 2019-12-21 LAB — STREP PNEUMONIAE URINARY ANTIGEN: Strep Pneumo Urinary Antigen: NEGATIVE

## 2019-12-21 LAB — CBC WITH DIFFERENTIAL/PLATELET
Abs Immature Granulocytes: 0.4 10*3/uL — ABNORMAL HIGH (ref 0.00–0.07)
Basophils Absolute: 0.1 10*3/uL (ref 0.0–0.1)
Basophils Relative: 0 %
Eosinophils Absolute: 0.2 10*3/uL (ref 0.0–0.5)
Eosinophils Relative: 1 %
HCT: 33.5 % — ABNORMAL LOW (ref 36.0–46.0)
Hemoglobin: 11.2 g/dL — ABNORMAL LOW (ref 12.0–15.0)
Immature Granulocytes: 2 %
Lymphocytes Relative: 1 %
Lymphs Abs: 0.3 10*3/uL — ABNORMAL LOW (ref 0.7–4.0)
MCH: 29.4 pg (ref 26.0–34.0)
MCHC: 33.4 g/dL (ref 30.0–36.0)
MCV: 87.9 fL (ref 80.0–100.0)
Monocytes Absolute: 0.3 10*3/uL (ref 0.1–1.0)
Monocytes Relative: 1 %
Neutro Abs: 21.8 10*3/uL — ABNORMAL HIGH (ref 1.7–7.7)
Neutrophils Relative %: 95 %
Platelets: 205 10*3/uL (ref 150–400)
RBC: 3.81 MIL/uL — ABNORMAL LOW (ref 3.87–5.11)
RDW: 13.2 % (ref 11.5–15.5)
Smear Review: NORMAL
WBC: 23.2 10*3/uL — ABNORMAL HIGH (ref 4.0–10.5)
nRBC: 0 % (ref 0.0–0.2)

## 2019-12-21 LAB — COMPREHENSIVE METABOLIC PANEL
ALT: 10 U/L (ref 0–44)
AST: 27 U/L (ref 15–41)
Albumin: 2.5 g/dL — ABNORMAL LOW (ref 3.5–5.0)
Alkaline Phosphatase: 94 U/L (ref 38–126)
Anion gap: 14 (ref 5–15)
BUN: 45 mg/dL — ABNORMAL HIGH (ref 8–23)
CO2: 17 mmol/L — ABNORMAL LOW (ref 22–32)
Calcium: 8.7 mg/dL — ABNORMAL LOW (ref 8.9–10.3)
Chloride: 106 mmol/L (ref 98–111)
Creatinine, Ser: 1.67 mg/dL — ABNORMAL HIGH (ref 0.44–1.00)
GFR calc Af Amer: 31 mL/min — ABNORMAL LOW (ref >60)
GFR calc non Af Amer: 27 mL/min — ABNORMAL LOW (ref >60)
Glucose, Bld: 183 mg/dL — ABNORMAL HIGH (ref 70–99)
Potassium: 4.8 mmol/L (ref 3.5–5.1)
Sodium: 137 mmol/L (ref 135–145)
Total Bilirubin: 0.9 mg/dL (ref 0.3–1.2)
Total Protein: 6.2 g/dL — ABNORMAL LOW (ref 6.5–8.1)

## 2019-12-21 LAB — URINALYSIS, ROUTINE W REFLEX MICROSCOPIC
Bacteria, UA: NONE SEEN
Bilirubin Urine: NEGATIVE
Glucose, UA: NEGATIVE mg/dL
Ketones, ur: NEGATIVE mg/dL
Nitrite: NEGATIVE
Protein, ur: 30 mg/dL — AB
Specific Gravity, Urine: 1.017 (ref 1.005–1.030)
Squamous Epithelial / HPF: NONE SEEN (ref 0–5)
pH: 5 (ref 5.0–8.0)

## 2019-12-21 LAB — PROTIME-INR
INR: 1.3 — ABNORMAL HIGH (ref 0.8–1.2)
Prothrombin Time: 15.9 seconds — ABNORMAL HIGH (ref 11.4–15.2)

## 2019-12-21 LAB — APTT: aPTT: <24 seconds — ABNORMAL LOW (ref 24–36)

## 2019-12-21 LAB — SARS CORONAVIRUS 2 BY RT PCR (HOSPITAL ORDER, PERFORMED IN ~~LOC~~ HOSPITAL LAB): SARS Coronavirus 2: NEGATIVE

## 2019-12-21 LAB — LACTIC ACID, PLASMA: Lactic Acid, Venous: 4.1 mmol/L (ref 0.5–1.9)

## 2019-12-21 MED ORDER — ONDANSETRON HCL 4 MG/2ML IJ SOLN: 4.0000 mg | Freq: Four times a day (QID) | INTRAMUSCULAR | Status: DC | PRN

## 2019-12-21 MED ORDER — ONDANSETRON HCL 4 MG/2ML IJ SOLN: 4.0000 mg | Freq: Once | INTRAMUSCULAR | Status: AC

## 2019-12-21 MED ORDER — VANCOMYCIN HCL 1250 MG/250ML IV SOLN
1250.0000 mg | Freq: Once | INTRAVENOUS | Status: AC
  Administered 2019-12-21: 1250 mg via INTRAVENOUS
  Filled 2019-12-21: qty 250

## 2019-12-21 MED ORDER — LORAZEPAM 2 MG/ML IJ SOLN: 2.0000 mg | INTRAMUSCULAR | Status: DC | PRN

## 2019-12-21 MED ORDER — MORPHINE 100MG IN NS 100ML (1MG/ML) PREMIX INFUSION
1.0000 mg/h | INTRAVENOUS | Status: DC
  Filled 2019-12-21: qty 100

## 2019-12-21 MED ORDER — METRONIDAZOLE IN NACL 5-0.79 MG/ML-% IV SOLN: 500.0000 mg | Freq: Three times a day (TID) | INTRAVENOUS | Status: DC

## 2019-12-21 MED ORDER — MORPHINE SULFATE (PF) 2 MG/ML IV SOLN
2.0000 mg | INTRAVENOUS | Status: DC | PRN
  Administered 2019-12-21: 2 mg via INTRAVENOUS
  Filled 2019-12-21: qty 1

## 2019-12-21 MED ORDER — MORPHINE SULFATE 20 MG/5ML PO SOLN: 5.0000 mg | ORAL | 0 refills | Status: AC | PRN

## 2019-12-21 MED ORDER — HEPARIN SODIUM (PORCINE) 5000 UNIT/ML IJ SOLN: 5000.0000 [IU] | Freq: Three times a day (TID) | INTRAMUSCULAR | Status: DC

## 2019-12-21 MED ORDER — SODIUM CHLORIDE 0.9 % IV SOLN
2.0000 g | Freq: Once | INTRAVENOUS | Status: AC
  Administered 2019-12-21: 2 g via INTRAVENOUS
  Filled 2019-12-21: qty 2

## 2019-12-21 MED ORDER — POLYETHYLENE GLYCOL 3350 17 G PO PACK: 17.0000 g | PACK | Freq: Every day | ORAL | Status: DC | PRN

## 2019-12-21 MED ORDER — SODIUM CHLORIDE 0.9 % IV SOLN: INTRAVENOUS | Status: DC

## 2019-12-21 MED ORDER — VANCOMYCIN HCL IN DEXTROSE 1-5 GM/200ML-% IV SOLN
1000.0000 mg | Freq: Once | INTRAVENOUS | Status: DC
  Administered 2019-12-21: 1000 mg via INTRAVENOUS
  Filled 2019-12-21: qty 200

## 2019-12-21 MED ORDER — METRONIDAZOLE IN NACL 5-0.79 MG/ML-% IV SOLN
500.0000 mg | Freq: Once | INTRAVENOUS | Status: AC
  Administered 2019-12-21: 500 mg via INTRAVENOUS
  Filled 2019-12-21: qty 100

## 2019-12-21 MED ORDER — SODIUM CHLORIDE 0.9 % IV SOLN
1.0000 g | Freq: Three times a day (TID) | INTRAVENOUS | Status: DC
  Filled 2019-12-21: qty 1

## 2019-12-21 MED ORDER — NOREPINEPHRINE 4 MG/250ML-% IV SOLN
2.0000 ug/min | INTRAVENOUS | Status: DC
  Administered 2019-12-21: 2 ug/min via INTRAVENOUS
  Filled 2019-12-21: qty 250

## 2019-12-21 MED ORDER — SODIUM CHLORIDE 0.9 % IV BOLUS (SEPSIS)
1000.0000 mL | Freq: Once | INTRAVENOUS | Status: AC
  Administered 2019-12-21: 1000 mL via INTRAVENOUS

## 2019-12-21 MED ORDER — INSULIN ASPART 100 UNIT/ML ~~LOC~~ SOLN: 0.0000 [IU] | SUBCUTANEOUS | Status: DC

## 2019-12-21 MED ORDER — SODIUM CHLORIDE 0.9 % IV SOLN: 250.0000 mL | INTRAVENOUS | Status: DC

## 2019-12-21 MED ORDER — DOCUSATE SODIUM 100 MG PO CAPS: 100.0000 mg | ORAL_CAPSULE | Freq: Two times a day (BID) | ORAL | Status: DC | PRN

## 2019-12-21 MED ORDER — GLYCOPYRROLATE 0.2 MG/ML IJ SOLN
0.4000 mg | INTRAMUSCULAR | Status: DC | PRN
  Filled 2019-12-21: qty 2

## 2019-12-21 MED ORDER — GLYCOPYRROLATE 0.2 MG/ML IJ SOLN
0.2000 mg | INTRAMUSCULAR | Status: DC | PRN
  Filled 2019-12-21: qty 1

## 2019-12-21 MED ORDER — ONDANSETRON HCL 4 MG/2ML IJ SOLN
INTRAMUSCULAR | Status: AC
  Administered 2019-12-21: 4 mg via INTRAVENOUS
  Filled 2019-12-21: qty 2

## 2019-12-21 MED ORDER — ACETAMINOPHEN 325 MG PO TABS: 650.0000 mg | ORAL_TABLET | ORAL | Status: DC | PRN

## 2019-12-21 NOTE — Progress Notes (Signed)
Pharmacy Antibiotic Note  Tonya Atkinson is a 86 y.o. female admitted on 12/21/2019 with sepsis.  Pharmacy has been consulted for vanc/azactam/flagyl dosing.  Plan: Patient received vanc 1.25g IV load, azactam 2g IV x 1, and flagyl 500 mg IV x 1  Patient appears to be in either AKI and/or CKD (no baseline to compare) will monitor am labs for renal function assessment and check a 12 hour vanc random level to assess dosing. Goal random level < 20 mcg/mL  Will continue azactam 1g IV q8h and flagyl 500 mg IV q8h and will continue to monitor.  Weight: 59 kg (130 lb)  Temp (24hrs), Avg:99.6 F (37.6 C), Min:97.4 F (36.3 C), Max:101.8 F (38.8 C)  Recent Labs  Lab 12/21/19 0130  WBC 23.2*  CREATININE 1.67*  LATICACIDVEN 4.1*    Estimated Creatinine Clearance: 18 mL/min (A) (by C-G formula based on SCr of 1.67 mg/dL (H)).    Allergies  Allergen Reactions  . Penicillins     Thank you for allowing pharmacy to be a part of this patient's care.  Tobie Lords, PharmD, BCPS Clinical Pharmacist 12/21/2019 4:21 AM

## 2019-12-21 NOTE — ED Triage Notes (Signed)
Pt came in by EMS from home, altered metal status. PT is alert and oriented per family. Had a period of unresponsiveness, did have a a bladder mesh removed 2 weeks ago. Pt did have a fever earlier today.

## 2019-12-21 NOTE — TOC Transition Note (Addendum)
Transition of Care [facility]) - CM/SW Discharge Note   Patient Details  Name: Tonya Atkinson MRN: [id number] Date of Birth: 07-16-1928  Transition of Care [facility] CM/SW Contact:  Anselm Pancoast, RN Phone Number: 12/21/2019, 11:49 AM   Clinical Narrative:     Confirmed with Kieth Brightly @ [facility]-Equipment has been delivered to the home and patient has 1pm appointment with admissions Hospice nurse. EDP notified. ED RN aware.         Patient Goals and CMS Choice        Discharge Placement  Home with Hospice-[facility].                      Discharge Plan and Services                                     Social Determinants of Health (SDOH) Interventions     Readmission Risk Interventions No flowsheet data found.

## 2019-12-21 NOTE — Consult Note (Signed)
Consultation Note Date: 12/21/2019   Patient Name: Tonya Atkinson  DOB: 02-20-1928  MRN: [id number]  Age / Sex: 86 y.o., female  PCP: Cletis Athens, MD Referring Physician: Ottie Glazier, MD  Reason for Consultation: Establishing goals of care  HPI/Patient Profile: 86 y.o. female  with past medical history of DM and HTN with a recent bladder mass removal 3 weeks ago admitted on 12/21/2019 with AMS.  Upon evaluation in ED, patient found to be febrile and tachycardic. Lactic acid 4/1 with WBC 23.2. Diagnosed with septic shock - pna vs UTI? Family has opted to focus on comfort and avoid aggressive interventions. PMT consulted to assist with comfort measures and evaluate for hospice.   Clinical Assessment and Goals of Care: I have reviewed medical records including EPIC notes, labs and imaging, received report from RN, assessed the patient and then met with patient's 3 children at bedside to discuss diagnosis prognosis, GOC, EOL wishes, disposition and options.  I introduced Palliative Medicine as specialized medical care for people living with serious illness. It focuses on providing relief from the symptoms and stress of a serious illness. The goal is to improve quality of life for both the patient and the family.  Family tells me prior to patient's current illness she was doing well - ambulatory at home. Recently went to beach a couple of weeks ago - required wheelchair during beach trip. She did need some assistance with ADLs which daughter provided.    We discussed patient's current illness and what it means in the larger context of patient's on-going co-morbidities.  Natural disease trajectory and expectations at EOL were discussed. I attempted to elicit values and goals of care important to the patient.  Family again voices that patient would not want her life artificially prolonged and they want to focus on her comfort and avoid any aggressive  interventions.   Disposition options were discussed: hospital admission for comfort care vs hospice facility vs hospice care at home. Family tells me patient would not want to go to a hospice facility so they would like to avoid that option. They tell me patient would like to be at home for her final days and are interested in hospice caring for her at home.   We also discussed symptom management - discussed what to look for to indicate pain, agitation, or dyspnea. Discussed medications used to treat symptoms. Family is agreeable to plan.   Questions and concerns were addressed. The family was encouraged to call with questions or concerns.   Discussed above with nursing staff, Dr. Lanney Gins, and hospice liaisons. Hopeful to avoid admission and get patient home with hospice support.  Primary Decision Maker NEXT OF KIN - 3 children present at bedside  SUMMARY OF RECOMMENDATIONS   - continue comfort measures - d/c morphine infusion - add PRN morphine - continue PRN ativan and robinul - hopeful for discharge home with hospital services and avoid hospital admission  Code Status/Advance Care Planning:  DNR  Additional Recommendations (Limitations, Scope, Preferences):  Full Comfort Care  Prognosis:   < 2 weeks  Discharge Planning: Home with Hospice      Primary Diagnoses: Present on Admission: . Sepsis ([place])   I have reviewed the medical record, interviewed the patient and family, and examined the patient. The following aspects are pertinent.  Past Medical History:  Diagnosis Date  . Cervical polyp   . Cystocele   . Cystocele   . Diabetes mellitus without complication ([place])   . Hypertension   .  Insomnia   . Procidentia of uterus   . Rectocele   . Uterine prolapse   . Vaginal abrasion   . Vaginal atrophy   . Vaginal erosion    secondary to pessary use   Social History   Socioeconomic History  . Marital status: Widowed    Spouse name: Not on file  . Number of  children: Not on file  . Years of education: Not on file  . Highest education level: Not on file  Occupational History  . Not on file  Tobacco Use  . Smoking status: Never Smoker  . Smokeless tobacco: Never Used  Substance and Sexual Activity  . Alcohol use: No    Alcohol/week: 0.0 standard drinks  . Drug use: No  . Sexual activity: Not Currently  Other Topics Concern  . Not on file  Social History Narrative  . Not on file   Social Determinants of Health   Financial Resource Strain:   . Difficulty of Paying Living Expenses:   Food Insecurity:   . Worried About Charity fundraiser in the Last Year:   . Arboriculturist in the Last Year:   Transportation Needs:   . Film/video editor (Medical):   Marland Kitchen Lack of Transportation (Non-Medical):   Physical Activity:   . Days of Exercise per Week:   . Minutes of Exercise per Session:   Stress:   . Feeling of Stress :   Social Connections:   . Frequency of Communication with Friends and Family:   . Frequency of Social Gatherings with Friends and Family:   . Attends Religious Services:   . Active Member of Clubs or Organizations:   . Attends Archivist Meetings:   Marland Kitchen Marital Status:    Family History  Problem Relation Age of Onset  . Cancer Neg Hx    Scheduled Meds: Continuous Infusions: . sodium chloride     PRN Meds:.acetaminophen, glycopyrrolate, LORazepam, morphine injection, ondansetron (ZOFRAN) IV Allergies  Allergen Reactions  . Penicillins    Review of Systems  Unable to perform ROS: Patient nonverbal    Physical Exam Constitutional:      General: She is not in acute distress.    Comments: Does not respond to verbal or physical stimulation, will occasionally open eyes but not to command, no s/s of pain  Pulmonary:     Effort: Pulmonary effort is normal. No respiratory distress.     Comments: Breathing regular Musculoskeletal:     Right lower leg: No edema.     Left lower leg: No edema.  Skin:     General: Skin is warm and dry.  Neurological:     Comments: Not interactive     Vital Signs: BP (!) 82/49   Pulse (!) 103   Temp (!) 97.4 F (36.3 C)   Resp (!) 22   Wt 59 kg   SpO2 99%   BMI 25.39 kg/m  Pain Scale: PAINAD       SpO2: SpO2: 99 % O2 Device:SpO2: 99 % O2 Flow Rate: .   IO: Intake/output summary:   Intake/Output Summary (Last 24 hours) at 12/21/2019 0926 Last data filed at 12/21/2019 0250 Gross per 24 hour  Intake 600 ml  Output --  Net 600 ml    LBM:   Baseline Weight: Weight: 59 kg Most recent weight: Weight: 59 kg     Palliative Assessment/Data: PPS 10%    Time Total: 50 minutes Greater than 50%  of this  time was spent counseling and coordinating care related to the above assessment and plan.  Juel Burrow, DNP, AGNP-C Palliative Medicine Team [phone] Pager: [phone]

## 2019-12-21 NOTE — Progress Notes (Signed)
Pt's daughters Tonya Atkinson and Tonya Atkinson at bedside.  Discussed with them, that their mother is critically ill, now with septic shock requiring vasopressers due to ? Pneumonia vs. UTI, severe metabolic acidosis that will need Bicarb gtt, and Acute Hypoxic Respiratory Failure secondary to right pleural effusion, metabolic acidosis, and ? Pneumonia, along with AKI.  Tonya Atkinson is her mother's primary caregiver, and states she and her mother have had discussions in the past about CODE STATUS, and she states her mother explicitly told her she would not want to be resuscitated or artificially maintained.  They request to change their mother to DNR/DNI.  We discussed the treatment options of continuing to treat the infection, sepsis, metabolic acidosis with IVF, antibiotics, bicarb gtt, and vasopressors with no guarantee that their mother will survive, versus shifting to comfort care measures.  Both Tonya Atkinson and Tonya Atkinson state that they do no want their mother to suffer as she "has had a good life and she is right with the Lord."  They both confirm that they would like to transition to [place], and discontinue all life sustaining treatments.  Orders placed for COMFORT MEASURES ONLY, and for morphine drip if needed.  They express interest in hospice at home, so will consult Palliative Care for assistance in possibly setting this up.  Will change admission order to Med/Surg unit.   PLAN OF CARE: -COMFORT MEASURES ONLY  -DNR/DNI -NURSE MAY PRONOUNCE -MORPHINE DRIP, Prn ATIVAN -PALLIATIVE CARE CONSULT        Darel Hong, AGACNP-BC [place] [facility] Pager: [phone]

## 2019-12-21 NOTE — ED Notes (Signed)
Pt's condition appears to be deteriorating at this time. New Critical Care orders placed at this time

## 2019-12-21 NOTE — ED Notes (Addendum)
NP Tonya Atkinson at bedside consulting with family. Pt CODE status changed to a DNR/DNI

## 2019-12-21 NOTE — Progress Notes (Signed)
PHARMACY -  BRIEF ANTIBIOTIC NOTE   Pharmacy has received consult(s) for vancomycin  from an ED provider.  The patient's profile has been reviewed for ht/wt/allergies/indication/available labs.    One time order(s) placed for vancomycin 1.25g IV load  Further antibiotics/pharmacy consults should be ordered by admitting physician if indicated.                       Thank you,  Tobie Lords, PharmD, BCPS Clinical Pharmacist 12/21/2019  1:45 AM\

## 2019-12-21 NOTE — ED Provider Notes (Signed)
Patient cleared for discharge to home hospice   [name], MD 12/21/19 1149

## 2019-12-21 NOTE — ED Notes (Signed)
This RN got in contact with Bethena Roys, daughter, to inform of positive blood culture result from Lab.  Family planning to stay at home and consult with hospice in the morning at the house.

## 2019-12-21 NOTE — ED Notes (Signed)
Pt has improved with O2. Left AC IV retaped.

## 2019-12-21 NOTE — ED Notes (Signed)
Pt placed on 4L of O2 via nasal cannula. Daughter at bedside. Pt continues to be mildly anxious. Daughter is a comfort.

## 2019-12-21 NOTE — Progress Notes (Signed)
[facility] Room ED [address] [facility] Hospital Liaison RN note:  Received request from Kathie Rhodes, NP for hospice services at home after discharge. Chart and patient information has been reviewed by [facility] physician. Hospice eligibility has been approved.  Spoke with Jackelyn Poling, patient's daughter, to initiate education education related to hospice philosophy, services and to answer any questions. Debbie verbalized understanding of information given. Per discussion, the plan is for discharge home by EMS today 06.03.21.  DME needs discussed. Family requests the following equipment for delivery to home: Hospital bed 1/2 rails; OBT; O2 @ 2L. Address has been verified and is correct in the chart. Lenoria Farrier, daughter,  is the family contact to arrange time of equipment delivery. Her number is [phone].  Please send signed and completed DNR home with patient/family.  Please provide prescriptions at discharge as needed to ensure ongoing symptom management.   [facility] information and contact numbers given to daughter Jackelyn Poling. Above information shared with Kathie Rhodes, NP and Andreas Newport, CM/SW.  Please call with any questions or concerns.  Thank you for the opportunity to participate in this patient's care.  Zandra Abts, RN [facility] Hos Liaison [phone]

## 2019-12-21 NOTE — Progress Notes (Signed)
CODE SEPSIS - PHARMACY COMMUNICATION  **Broad Spectrum Antibiotics should be administered within 1 hour of Sepsis diagnosis**  Time Code Sepsis Called/Page Received: 0133  Antibiotics Ordered: azactam/vanc/flagyl  Time of 1st antibiotic administration: 0145  Additional action taken by pharmacy:   If necessary, Name of Provider/Nurse Contacted:     Tobie Lords ,PharmD Clinical Pharmacist  12/21/2019  1:48 AM

## 2019-12-21 NOTE — Telephone Encounter (Signed)
Patients sister Tonya Atkinson called in to let Dr. Amalia Hailey know that [name] was admitted to the hospital last night and that they told her she may not make it. Tonya Atkinson stated that she thought Dr. Amalia Hailey would like to know.

## 2019-12-21 NOTE — ED Provider Notes (Signed)
[facility] Emergency Department Provider Note  ____________________________________________   First MD Initiated Contact with Patient 12/21/19 0129     (approximate)  I have reviewed the triage vital signs and the nursing notes.  Level 5 caveat history review of systems limited secondary to altered mental status HISTORY  Chief Complaint Altered Mental Status   HPI Tonya Atkinson is a 86 y.o. female with below list of previous medical conditions including recent bladder mass removal 3 weeks ago per EMS and recent call to EMS yesterday secondary to being unwell but refused transport to the hospital presents to the emergency department tonight via EMS with altered mental status.  Patient febrile for EMS hypotensive with tachypnea on arrival to the emergency department patient alert but confused unable to answer questions.  Patient febrile on presentation temperature 101.8 tachycardic with heart rate 120 blood pressure currently 156 with a respiratory rate of 24.        Past Medical History:  Diagnosis Date  . Cervical polyp   . Cystocele   . Cystocele   . Diabetes mellitus without complication ([place])   . Hypertension   . Insomnia   . Procidentia of uterus   . Rectocele   . Uterine prolapse   . Vaginal abrasion   . Vaginal atrophy   . Vaginal erosion    secondary to pessary use    Patient Active Problem List   Diagnosis Date Noted  . Sepsis ([place]) 12/21/2019  . Squamous cell carcinoma, arm 09/16/2015  . GERD (gastroesophageal reflux disease) 03/14/2015  . Chronic hypertension 03/14/2015  . Uterine prolapse 12/20/2014  . Cystocele, midline 12/20/2014  . Rectocele 12/20/2014    Past Surgical History:  Procedure Laterality Date  . TUBAL LIGATION      Prior to Admission medications   Medication Sig Start Date End Date Taking? Authorizing Provider  aspirin EC 81 MG tablet Take 81 mg by mouth daily.    [provider]  [facility] VITAMIN D3  1000 UNITS capsule Take 1,000 Units by mouth daily. 12/24/14   [provider]  enalapril (VASOTEC) 2.5 MG tablet Take 1 tablet (2.5 mg total) by mouth daily. 12/15/19   Cletis Athens, MD  estradiol (ESTRACE VAGINAL) 0.1 MG/GM vaginal cream Apply twice a week. 01/17/19   Harlin Heys, MD  Fexofenadine HCl [facility] ALLERGY PO) Take by mouth as needed.    [provider]  hydrochlorothiazide (HYDRODIURIL) 25 MG tablet TAKE 1 TABLET BY MOUTH EVERY DAY 12/14/19   Cletis Athens, MD  hydrOXYzine (ATARAX/VISTARIL) 10 MG tablet Take 10 mg by mouth 3 (three) times daily as needed.    [provider]  KLOR-CON M20 20 MEQ tablet Take 20 mEq by mouth 2 (two) times daily. 10/11/14   [provider]  loratadine (CLARITIN) 10 MG tablet Take 10 mg by mouth daily. 10/01/14   [provider]  metFORMIN (GLUCOPHAGE) 500 MG tablet Take 1 tablet (500 mg total) by mouth 2 (two) times daily. 12/07/19 03/06/20  Cletis Athens, MD  omeprazole (PRILOSEC) 20 MG capsule  11/18/14   [provider]  sertraline (ZOLOFT) 25 MG tablet Take 25 mg by mouth daily. 04/12/18   [provider]  triamcinolone ointment (KENALOG) 0.1 % APPLY INTO VAGINA AS DIRECTED EVERY OTHER DAY, FIRST DOSE ON 10/6 AT 11:15 AM FOR 30 DAYS 11/17/19   Harlin Heys, MD    Allergies Penicillins  Family History  Problem Relation Age of Onset  . Cancer  Neg Hx     Social History Social History   Tobacco Use  . Smoking status: Never Smoker  . Smokeless tobacco: Never Used  Substance Use Topics  . Alcohol use: No    Alcohol/week: 0.0 standard drinks  . Drug use: No    Review of Systems Constitutional: Positive for fever/chills Eyes: No visual changes. ENT: No sore throat. Cardiovascular: Denies chest pain. Respiratory: Denies shortness of breath. Gastrointestinal: No abdominal pain.  No nausea, no vomiting.  No diarrhea.  No constipation. Genitourinary: Negative for  dysuria. Musculoskeletal: Negative for neck pain.  Negative for back pain. Integumentary: Negative for rash. Neurological: Negative for headaches, focal weakness or numbness.   ____________________________________________   PHYSICAL EXAM:  VITAL SIGNS: ED Triage Vitals  Enc Vitals Group     BP 12/21/19 0128 (!) 103/56     Pulse Rate 12/21/19 0128 (!) 120     Resp 12/21/19 0128 (!) 24     Temp 12/21/19 0128 (!) 101.8 F (38.8 C)     Temp Source 12/21/19 0128 Rectal     SpO2 12/21/19 0128 95 %     Weight 12/21/19 0126 59 kg (130 lb)     Height --      Head Circumference --      Peak Flow --      Pain Score --      Pain Loc --      Pain Edu? --      Excl. in [place]? --     Constitutional: Alert but confused disoriented to person place time and situation. Eyes: Conjunctivae are normal.  Mouth/Throat: Oral mucosa Neck: No stridor.  No meningeal signs.   Cardiovascular: Normal rate, regular rhythm. Good peripheral circulation. Grossly normal heart sounds. Respiratory: Normal respiratory effort.  No retractions. Gastrointestinal: Soft and nontender. No distention.  Musculoskeletal: No lower extremity tenderness nor edema. No gross deformities of extremities. Neurologic: Nonsensical speech. No gross focal neurologic deficits are appreciated.  Skin:  Skin is warm, dry and intact. Psychiatric: Mood and affect are normal. Speech and behavior are normal.  ____________________________________________   LABS (all labs ordered are listed, but only abnormal results are displayed)  Labs Reviewed  LACTIC ACID, PLASMA - Abnormal; Notable for the following components:      Result Value   Lactic Acid, Venous 4.1 (*)    All other components within normal limits  COMPREHENSIVE METABOLIC PANEL - Abnormal; Notable for the following components:   CO2 17 (*)    Glucose, Bld 183 (*)    BUN 45 (*)    Creatinine, Ser 1.67 (*)    Calcium 8.7 (*)    Total Protein 6.2 (*)    Albumin 2.5 (*)     GFR calc non Af Amer 27 (*)    GFR calc Af Amer 31 (*)    All other components within normal limits  CBC WITH DIFFERENTIAL/PLATELET - Abnormal; Notable for the following components:   WBC 23.2 (*)    RBC 3.81 (*)    Hemoglobin 11.2 (*)    HCT 33.5 (*)    Neutro Abs 21.8 (*)    Lymphs Abs 0.3 (*)    Abs Immature Granulocytes 0.40 (*)    All other components within normal limits  APTT - Abnormal; Notable for the following components:   aPTT <24 (*)    All other components within normal limits  PROTIME-INR - Abnormal; Notable for the following components:   Prothrombin Time 15.9 (*)  INR 1.3 (*)    All other components within normal limits  URINALYSIS, ROUTINE W REFLEX MICROSCOPIC - Abnormal; Notable for the following components:   Color, Urine YELLOW (*)    APPearance TURBID (*)    Hgb urine dipstick MODERATE (*)    Protein, ur 30 (*)    Leukocytes,Ua LARGE (*)    All other components within normal limits  CULTURE, BLOOD (ROUTINE X 2)  CULTURE, BLOOD (ROUTINE X 2)  URINE CULTURE  SARS CORONAVIRUS 2 BY RT PCR (HOSPITAL ORDER, [place] [facility])  LACTIC ACID, PLASMA  PROCALCITONIN  STREP PNEUMONIAE URINARY ANTIGEN  HEMOGLOBIN A1C  VANCOMYCIN, RANDOM  LEGIONELLA PNEUMOPHILA SEROGP 1 UR AG  BLOOD GAS, ARTERIAL   ____________________________________________  EKG  ED ECG REPORT I, [place] N Brion Hedges, the attending physician, personally viewed and interpreted this ECG.   Date: 12/21/2019  EKG Time: 1:27 AM  Rate: 121  Rhythm: Sinus tachycardia  Axis: Normal  Intervals: Normal  ST&T Change: None  ____________________________________________  RADIOLOGY I, [place] N Anari Evitt, personally viewed and evaluated these images (plain radiographs) as part of my medical decision making, as well as reviewing the written report by the radiologist.  ED MD interpretation: Moderate sized right-sided pleural effusion with likely underlying atelectasis/infiltrate  per radiologist on chest x-ray impression  Official radiology report(s): DG Chest Port 1 View  Result Date: 12/21/2019 CLINICAL DATA:  Cough and fevers EXAM: PORTABLE CHEST 1 VIEW COMPARISON:  10/31/2012 FINDINGS: Cardiac shadow is mildly enlarged. Aortic calcifications are seen. The left lung is well aerated without focal abnormality. Right-sided moderate pleural effusion is seen likely with underlying atelectasis/infiltrate. Degenerative changes of the thoracic spine are noted. IMPRESSION: Moderate-sized right-sided pleural effusion with likely underlying atelectasis/infiltrate. Electronically Signed   By: Inez Catalina M.D.   On: 12/21/2019 02:19    ____________________________________________   PROCEDURES     .Critical Care Performed by: Gregor Hams, MD Authorized by: Gregor Hams, MD   Critical care provider statement:    Critical care time (minutes):  60   Critical care time was exclusive of:  Separately billable procedures and treating other patients   Critical care was necessary to treat or prevent imminent or life-threatening deterioration of the following conditions:  Sepsis   Critical care was time spent personally by me on the following activities:  Development of treatment plan with patient or surrogate, discussions with consultants, evaluation of patient's response to treatment, examination of patient, obtaining history from patient or surrogate, ordering and performing treatments and interventions, ordering and review of laboratory studies, ordering and review of radiographic studies, pulse oximetry, re-evaluation of patient's condition and review of old charts     ____________________________________________   INITIAL IMPRESSION / MDM / [place] / ED COURSE  As part of my medical decision making, I reviewed the following data within the electronic MEDICAL RECORD NUMBER   86 year old female presented with above-stated history and physical exam  consistent with sepsis.  As such sepsis protocol was initiated patient received 30 mils per kilogram of IV normal saline appropriate IV antibiotic therapy was given after blood cultures were obtained.  Laboratory data revealed a lactic acid of 4.1 and white blood cell count of 23.  Patient discussed with Darlyn Chamber NP ICU staff and was admitted for further evaluation and management.  ____________________________________________  FINAL CLINICAL IMPRESSION(S) / ED DIAGNOSES  Final diagnoses:  Sepsis, due to unspecified organism, unspecified whether acute organ dysfunction present ([place])  MEDICATIONS GIVEN DURING THIS VISIT:  Medications  docusate sodium (COLACE) capsule 100 mg (has no administration in time range)  polyethylene glycol (MIRALAX / GLYCOLAX) packet 17 g (has no administration in time range)  heparin injection 5,000 Units (has no administration in time range)  acetaminophen (TYLENOL) tablet 650 mg (has no administration in time range)  ondansetron (ZOFRAN) injection 4 mg (has no administration in time range)  insulin aspart (novoLOG) injection 0-15 Units (has no administration in time range)  0.9 %  sodium chloride infusion ( Intravenous New Bag/Given 12/21/19 0356)  0.9 %  sodium chloride infusion (has no administration in time range)  norepinephrine (LEVOPHED) 4mg  in 269mL premix infusion (has no administration in time range)  aztreonam (AZACTAM) 1 g in sodium chloride 0.9 % 100 mL IVPB (has no administration in time range)  metroNIDAZOLE (FLAGYL) IVPB 500 mg (has no administration in time range)  sodium chloride 0.9 % bolus 1,000 mL (0 mLs Intravenous Stopped 12/21/19 0250)    And  sodium chloride 0.9 % bolus 1,000 mL (0 mLs Intravenous Stopped 12/21/19 0250)  aztreonam (AZACTAM) 2 g in sodium chloride 0.9 % 100 mL IVPB (0 g Intravenous Stopped 12/21/19 0251)  metroNIDAZOLE (FLAGYL) IVPB 500 mg (0 mg Intravenous Stopped 12/21/19 0259)  vancomycin (VANCOREADY) IVPB 1250 mg/250 mL (0 mg  Intravenous Stopped 12/21/19 0355)  ondansetron (ZOFRAN) injection 4 mg (4 mg Intravenous Given 12/21/19 0151)     ED Discharge Orders    None      *Please note:  Tonya Atkinson was evaluated in Emergency Department on 12/21/2019 for the symptoms described in the history of present illness. She was evaluated in the context of the global COVID-19 pandemic, which necessitated consideration that the patient might be at risk for infection with the SARS-CoV-2 virus that causes COVID-19. Institutional protocols and algorithms that pertain to the evaluation of patients at risk for COVID-19 are in a state of rapid change based on information released by regulatory bodies including the CDC and federal and state organizations. These policies and algorithms were followed during the patient's care in the ED.  Some ED evaluations and interventions may be delayed as a result of limited staffing during the pandemic.*  Note:  This document was prepared using Dragon voice recognition software and may include unintentional dictation errors.   Gregor Hams, MD 12/21/19 [phone]

## 2019-12-21 NOTE — H&P (Signed)
Name: Tonya Atkinson MRN: [id number] DOB: 10-05-1927    ADMISSION DATE:  12/21/2019 CONSULTATION DATE:  12/21/2019  REFERRING MD :  Dr. Owens Shark  CHIEF COMPLAINT:  Altered Mental Status  BRIEF PATIENT DESCRIPTION:  86 y.o. Female admitted 12/21/19 with septic shock in setting of questionable pneumonia vs UTI, severe metabolic acidosis, Acute Hypoxic Respiratory Failure in setting of right pleural effusion, questionable pneumonia, and severe metabolic acidosis, along with AKI.  SIGNIFICANT EVENTS  6/3: To admit to ICU 6/3: Family discussion at bedside; family desires transition to [place]:  6/3: CXR>>Cardiac shadow is mildly enlarged. Aortic calcifications are seen. The left lung is well aerated without focal abnormality. Right-sided moderate pleural effusion is seen likely with underlying atelectasis/infiltrate. Degenerative changes of the thoracic spine are noted. 6/3: Renal US>> 6/3: 2D Echocardiogram>>  CULTURES: SARS-CoV-2 PCR 6/3>> Blood culture x2 6/3>> Urine 6/3>> Strep pneumo urinary antigen 6/3>> Legionella urinary antigen 6/3>>  ANTIBIOTICS: Aztreonam 6/3>> Vancomycin 6/3>> Flagyl 6/3>>  HISTORY OF PRESENT ILLNESS:   Tonya Atkinson is a 86 year old female with a past medical history significant for recent bladder mass with removal 3 weeks ago, hypertension, diabetes mellitus, and cervical polyp who presents to [facility] ED on 12/21/2019 due to altered mental status.  Patient is currently altered, therefore history is obtained from ED and nursing notes, along with patient's daughters at bedside.  Per reports she was feeling unwell yesterday, therefore EMS was dispatched, but patient refused to come to the hospital.  Later in the evening she was noted to be altered, and was found to be hypotensive and tachypneic.  Upon presentation to the ED she was noted to be febrile with temperature 101.8, tachycardic with heart rate 120, blood pressure 103/56, and respiratory rate  24.  Initial work-up in the ED revealed WBC 23.2 with neutrophilia and mild left shift, lactic acid 4.1, bicarb 17, BUN 45, creatinine 1.67, glucose 183, hemoglobin 11.2.  EKG with sinus tachycardia, no ST elevation or depression. Urinalysis with leukocytes, however no bacteria or WBCs seen.  Chest x-ray with moderate sized right pleural effusion and suspected underlying infiltrate versus atelectasis.  She met sepsis criteria therefore she received IV fluid resuscitation and received IV aztreonam, Flagyl, and vancomycin.  Her work of breathing continues to increase, and she became progressively more hypotensive requiring vasopressor initiation.  ABG with severe metabolic acidosis (pH 5.57 / pCO2 20 / pO2 149 / Bicarb 11).  Her COVID-19 PCR is pending.  PCCM was asked to admit the patient to ICU for further work-up and treatment of septic shock secondary to questionable pneumonia versus questionable UTI, severe metabolic acidosis needing bicarb drip, and acute hypoxic respiratory failure secondary to right pleural effusion, questionable pneumonia, and severe metabolic acidosis, along with AKI.   PAST MEDICAL HISTORY :   has a past medical history of Cervical polyp, Cystocele, Cystocele, Diabetes mellitus without complication ([place]), Hypertension, Insomnia, Procidentia of uterus, Rectocele, Uterine prolapse, Vaginal abrasion, Vaginal atrophy, and Vaginal erosion.  has a past surgical history that includes Tubal ligation. Prior to Admission medications   Medication Sig Start Date End Date Taking? Authorizing Provider  aspirin EC 81 MG tablet Take 81 mg by mouth daily.    [provider]  CVS VITAMIN D3 1000 UNITS capsule Take 1,000 Units by mouth daily. 12/24/14   [provider]  enalapril (VASOTEC) 2.5 MG tablet Take 1 tablet (2.5 mg total) by mouth daily. 12/15/19   Cletis Athens, MD  estradiol (ESTRACE VAGINAL) 0.1 MG/GM  vaginal cream Apply twice a week. 01/17/19   Harlin Heys, MD    Fexofenadine HCl [facility] ALLERGY PO) Take by mouth as needed.    [provider]  hydrochlorothiazide (HYDRODIURIL) 25 MG tablet TAKE 1 TABLET BY MOUTH EVERY DAY 12/14/19   Cletis Athens, MD  hydrOXYzine (ATARAX/VISTARIL) 10 MG tablet Take 10 mg by mouth 3 (three) times daily as needed.    [provider]  KLOR-CON M20 20 MEQ tablet Take 20 mEq by mouth 2 (two) times daily. 10/11/14   [provider]  loratadine (CLARITIN) 10 MG tablet Take 10 mg by mouth daily. 10/01/14   [provider]  metFORMIN (GLUCOPHAGE) 500 MG tablet Take 1 tablet (500 mg total) by mouth 2 (two) times daily. 12/07/19 03/06/20  Cletis Athens, MD  omeprazole (PRILOSEC) 20 MG capsule  11/18/14   [provider]  sertraline (ZOLOFT) 25 MG tablet Take 25 mg by mouth daily. 04/12/18   [provider]  triamcinolone ointment (KENALOG) 0.1 % APPLY INTO VAGINA AS DIRECTED EVERY OTHER DAY, FIRST DOSE ON 10/6 AT 11:15 AM FOR 30 DAYS 11/17/19   Harlin Heys, MD   Allergies  Allergen Reactions  . Penicillins     FAMILY HISTORY:  family history is not on file. SOCIAL HISTORY:  reports that she has never smoked. She has never used smokeless tobacco. She reports that she does not drink alcohol or use drugs.   COVID-19 DISASTER DECLARATION:  FULL CONTACT PHYSICAL EXAMINATION WAS NOT POSSIBLE DUE TO TREATMENT OF COVID-19 AND  CONSERVATION OF PERSONAL PROTECTIVE EQUIPMENT, LIMITED EXAM FINDINGS INCLUDE-  Patient assessed or the symptoms described in the history of present illness.  In the context of the Global COVID-19 pandemic, which necessitated consideration that the patient might be at risk for infection with the SARS-CoV-2 virus that causes COVID-19, Institutional protocols and algorithms that pertain to the evaluation of patients at risk for COVID-19 are in a state of rapid change based on information released by regulatory bodies including the CDC and federal and state  organizations. These policies and algorithms were followed during the patient's care while in hospital.  REVIEW OF SYSTEMS:  Unable to assess due to AMS and critical illness  SUBJECTIVE:  Unable to assess due to AMS and critical illness  VITAL SIGNS: Temp:  [101.8 F (38.8 C)] 101.8 F (38.8 C) (06/03 0128) Pulse Rate:  [114-120] 114 (06/03 0250) Resp:  [23-29] 26 (06/03 0250) BP: (90-105)/(56-60) 105/60 (06/03 0250) SpO2:  [95 %-98 %] 98 % (06/03 0250) Weight:  [59 kg] 59 kg (06/03 0126)  PHYSICAL EXAMINATION: General:  Acutely ill appearing female, laying in bed, on 4L , in NAD Neuro:  Lethargic, withdraws from pain with purposeful movements, but will not follow commands, Pupils PERRLA HEENT:  Atraumatic, normocephalic, neck supple, no JVD Cardiovascular:  Regular rate & rhythm, s1s2, no M/R/G, 2+ pulses Lungs:  Coarse breath sounds with mild expiratory wheezing bilaterally, tachypnea, even, mild increased work of breathing Abdomen:  Soft, nontender, nondistended, no guarding or rebound tenderness, BS+ x4 Musculoskeletal:  No deformities, no edema Skin:  Warm and dry.  No obvious rashes, lesions, or ulcerations  Recent Labs  Lab 12/21/19 0130  NA 137  K 4.8  CL 106  CO2 17*  BUN 45*  CREATININE 1.67*  GLUCOSE 183*   Recent Labs  Lab 12/21/19 0130  HGB 11.2*  HCT 33.5*  WBC 23.2*  PLT 205   DG Chest Port 1 View  Result  Date: 12/21/2019 CLINICAL DATA:  Cough and fevers EXAM: PORTABLE CHEST 1 VIEW COMPARISON:  10/31/2012 FINDINGS: Cardiac shadow is mildly enlarged. Aortic calcifications are seen. The left lung is well aerated without focal abnormality. Right-sided moderate pleural effusion is seen likely with underlying atelectasis/infiltrate. Degenerative changes of the thoracic spine are noted. IMPRESSION: Moderate-sized right-sided pleural effusion with likely underlying atelectasis/infiltrate. Electronically Signed   By: Inez Catalina M.D.   On: 12/21/2019 02:19     ASSESSMENT / PLAN:  Septic Shock  -Continuous cardiac monitoring -Maintain MAP >65 -Received IV Fluid resuscitation in ED -Vasopressors if needed to maintain MAP goal -Trend lactic acid -Check 2D Echocardiogram  Severe Sepsis in setting of ? Pneumonia vs. ? UTI -Urinalysis with Leukocytes, but no bacteria or WBC's -CXR with right pleural effusion and underlying infiltrate vs. Atelectasis -Abdominal exam is benign -Monitor fever curve -Trend WBC's & Procalcitonin -Follow cultures as above -Continue Aztreonam, Flagyl, & Vancomycin for now  Acute Hypoxic Respiratory Failure in setting of moderate Right Pleural Effusion, ? Pneumonia, and severe Metabolic Acidosis -Supplemental O2 as needed to maintain O2 sats >92% -High risk for intubation -Follow intermittent CXR & ABG as needed -Consider Thoracentesis -Antibiotics as above -Bicarb gtt  AKI Severe Anion Gap Metabolic Acidosis in setting of Lactic Acidosis -Monitor I&O's / urinary output -Follow BMP -Ensure adequate renal perfusion -Avoid nephrotoxic agents as able -Replace electrolytes as indicated -IV Fluids -Obtain Renal US -Trend lactic acid -Bicarb gtt  Anemia without S/Sx of bleeding -Monitor for S/Sx of bleeding -Trend CBC -SQ Heparin for VTE Prophylaxis  -Transfuse for Hgb <7  Diabetes Mellitus -CBG's -SSI -Follow ICU Hypo/hyperglycemia protocol -Hold home metformin for now       BEST PRACTICES DISPOSITION: ICU GOALS OF CARE: DNR/DNI VTE PROPHYLAXIS: SQ Heparin CONSULTS: None UPDATES: Updated pt's daughter's Tonya Atkinson & Tonya Atkinson at bedside.  They request their mother be made DNR/DNI, and to transition to Comfort Measures only.  See follow up Progress note for details of our full discussion.  Tonya Atkinson, AGACNP-BC [facility] Pager: [phone]  12/21/2019, 3:05 AM

## 2019-12-21 NOTE — ED Notes (Signed)
[facility]  CALLED  FOR  TRANSPORT  TO  HOME 

## 2019-12-21 NOTE — TOC Initial Note (Addendum)
Transition of Care [facility]) - Initial/Assessment Note    Patient Details  Name: Tonya Atkinson MRN: [id number] Date of Birth: 09-08-1927  Transition of Care [facility]) CM/SW Contact:    Anselm Pancoast, RN Phone Number: 12/21/2019, 9:33 AM  Clinical Narrative:                 Spoke to Flo Shanks, [facility] states staff is currently working on Hospice discharge at family request and no other needs. Patient will discharge home with [facility] following.        Patient Goals and CMS Choice        Expected Discharge Plan and Services                                                Prior Living Arrangements/Services                       Activities of Daily Living      Permission Sought/Granted                  Emotional Assessment              Admission diagnosis:  Sepsis [facility]) [A41.9] Patient Active Problem List   Diagnosis Date Noted  . Sepsis ([place]) 12/21/2019  . Squamous cell carcinoma, arm 09/16/2015  . GERD (gastroesophageal reflux disease) 03/14/2015  . Chronic hypertension 03/14/2015  . Uterine prolapse 12/20/2014  . Cystocele, midline 12/20/2014  . Rectocele 12/20/2014   PCP:  Cletis Athens, MD Pharmacy:   [id number] [place], [address] [place] [address] Phone: [phone] Fax: [phone]     Social Determinants of Health (SDOH) Interventions    Readmission Risk Interventions No flowsheet data found.

## 2019-12-21 NOTE — Progress Notes (Signed)
Engineer, manufacturing systems Note  Family called concerned that patient was not discharged home with any pain medications. Discussed with Dr. Jimmye Norman who sent Rx for liquid morphine to [facility] in [place]. Spoke with Clarise Cruz at [facility] who verified they do have the prescription, they will provide syringes and education to family. Called Bethena Roys (daughter) back and informed of plan. Admission visit for hospice is scheduled for 9 am tomorrow, family knows they can call the after hours number at [phone] for any additional needs.  Thank you, Margaretmary Eddy, BSN, RN [facility] Liaison [phone]

## 2019-12-23 LAB — CULTURE, BLOOD (ROUTINE X 2): Special Requests: ADEQUATE

## 2019-12-23 LAB — URINE CULTURE: Culture: >=100000 — AB

## 2019-12-25 NOTE — Telephone Encounter (Signed)
Spoke with patients daughter about mother being on at home and has Hospice there. Tonya Atkinson went to the hospital and started declining health wise. She is now at home and Hospice is there to take care of her. Her daughter stated that they are not expecting her to make it. I told daughter I am so sorry and let us know if there is anything we can do. Daughter thanked me.

## 2019-12-26 ENCOUNTER — Other Ambulatory Visit: Payer: Self-pay | Admitting: Obstetrics and Gynecology

## 2019-12-26 LAB — CULTURE, BLOOD (ROUTINE X 2)
Culture: NO GROWTH
Special Requests: ADEQUATE

## 2020-01-02 ENCOUNTER — Encounter: Payer: Medicare Other | Admitting: Obstetrics and Gynecology

## 2020-01-04 ENCOUNTER — Encounter: Payer: Medicare Other | Admitting: Obstetrics and Gynecology

## 2020-01-18 DEATH — deceased

## 2020-02-01 ENCOUNTER — Ambulatory Visit: Payer: Medicare Other | Admitting: Internal Medicine
